# Patient Record
Sex: Female | Born: 1975
Health system: Southern US, Community
[De-identification: ages and names within clinical notes are randomized; demographics above are authoritative.]

## PROBLEM LIST (undated history)

## (undated) DIAGNOSIS — Z9889 Other specified postprocedural states: Secondary | ICD-10-CM

## (undated) DIAGNOSIS — T4145XA Adverse effect of unspecified anesthetic, initial encounter: Secondary | ICD-10-CM

## (undated) DIAGNOSIS — T8859XA Other complications of anesthesia, initial encounter: Secondary | ICD-10-CM

## (undated) DIAGNOSIS — J302 Other seasonal allergic rhinitis: Secondary | ICD-10-CM

## (undated) DIAGNOSIS — R112 Nausea with vomiting, unspecified: Secondary | ICD-10-CM

## (undated) DIAGNOSIS — F3281 Premenstrual dysphoric disorder: Secondary | ICD-10-CM

## (undated) DIAGNOSIS — J45909 Unspecified asthma, uncomplicated: Secondary | ICD-10-CM

## (undated) HISTORY — DX: Premenstrual dysphoric disorder: F32.81

## (undated) HISTORY — PX: ABLATION: SHX5711

---
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
## 2012-01-30 HISTORY — PX: TUBAL LIGATION: SHX77

## 2013-04-09 ENCOUNTER — Encounter: Payer: Self-pay | Admitting: Family Medicine

## 2013-04-09 ENCOUNTER — Ambulatory Visit (INDEPENDENT_AMBULATORY_CARE_PROVIDER_SITE_OTHER): Payer: 59 | Admitting: Family Medicine

## 2013-04-09 VITALS — BP 110/72 | HR 91 | Ht 64.5 in | Wt 151.0 lb

## 2013-04-09 DIAGNOSIS — N943 Premenstrual tension syndrome: Secondary | ICD-10-CM

## 2013-04-09 DIAGNOSIS — R41840 Attention and concentration deficit: Secondary | ICD-10-CM

## 2013-04-09 DIAGNOSIS — F3281 Premenstrual dysphoric disorder: Secondary | ICD-10-CM | POA: Insufficient documentation

## 2013-04-09 MED ORDER — CITALOPRAM HYDROBROMIDE 20 MG PO TABS: 20.0000 mg | ORAL_TABLET | Freq: Every day | ORAL | Status: DC

## 2013-04-09 NOTE — Progress Notes (Signed)
CC: Megan Roman is a 37 y.o. 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 Symptoms are predictable and moderate in severity. They improve after she is done with menses. She reports irritability and emotional swings during these periods. Nothing makes better or worse. She has been on BuSpar in the past without much benefit. She has been on birth control medicine in the past to help regulate cycles however worsened symptoms and usually have worse side effects than presenting problem. Symptoms can be on a daily basis during the above timeline.  Patient reports difficulty completing 1 past prior to starting another one. This has been present for years has not been getting better or worse. She does feel it gets in the way of her job and home responsibilities. Mild to moderate in severity. Symptoms are greatly improved when she was taking phentermine for diet. Has never been on medication for concentration difficulty. Symptoms are present on a daily basis.  Review of Systems - General ROS: negative for - chills, fever, night sweats, weight gain or weight loss Ophthalmic ROS: negative for - decreased vision Psychological ROS: negative for - anxiety or depression other than described above near menses ENT ROS: negative for - hearing change, nasal congestion, tinnitus or allergies Hematological and Lymphatic ROS: negative for - bleeding problems, bruising or swollen lymph nodes Breast ROS: negative Respiratory ROS: no cough, shortness of breath, or wheezing Cardiovascular ROS: no chest pain or dyspnea on exertion Gastrointestinal ROS: no abdominal pain, change in bowel habits, or black or bloody stools Genito-Urinary ROS: negative for - genital discharge, genital ulcers, incontinence or  abnormal bleeding from genitals Musculoskeletal ROS: negative for - joint pain or muscle pain Neurological ROS: negative for - headaches or memory loss Dermatological ROS: negative for lumps, mole changes, rash and skin lesion changes  History reviewed. No pertinent past medical history.   Family History  Problem Relation Age of Onset  . Alcohol abuse Paternal Grandfather   . Depression Mother   . Diabetes      paternal grandparents  . Hyperlipidemia Father   . Hypertension Father      History  Substance Use Topics  . Smoking status: Never Smoker   . Smokeless tobacco: Not on file  . Alcohol Use: Yes     Comment: less than 1 a week     Objective: Filed Vitals:   04/09/13 0905  BP: 110/72  Pulse: 91    General: Alert and Oriented, No Acute Distress HEENT: Pupils equal, round, reactive to light. Conjunctivae clear.  Moist mucous membranes pharynx unremarkable Lungs: Clear to auscultation bilaterally, no wheezing/ronchi/rales.  Comfortable work of breathing. Good air movement. Cardiac: Regular rate and rhythm. Normal S1/S2.  No murmurs, rubs, nor gallops.   Extremities: No peripheral edema.  Strong peripheral pulses.  Mental Status: No depression, anxiety, nor agitation. Skin: Warm and dry.  Assessment & Plan: [name] was seen today for establish care and discuss anxiety issues.  Diagnoses and associated orders for this visit:  PMDD (premenstrual dysphoric disorder) - citalopram (CELEXA) 20 MG tablet; Take 1 tablet (20 mg total) by mouth daily.  Attention and concentration deficit - Ambulatory referral to Psychiatry    PMDD: Start citalopram on a daily basis return in one month for reevaluation of symptoms Attention and concentration  difficulty:   referral for ADHD evaluation, will be interesting to see if anxiety and depression are improved by citalopram and if this carries over to help with attention and concentration Return in about 4 weeks (around 05/07/2013) for  PMDD FU.

## 2013-04-22 ENCOUNTER — Telehealth: Payer: Self-pay | Admitting: *Deleted

## 2013-04-22 DIAGNOSIS — Z20828 Contact with and (suspected) exposure to other viral communicable diseases: Secondary | ICD-10-CM

## 2013-04-22 MED ORDER — OSELTAMIVIR PHOSPHATE 75 MG PO CAPS: 75.0000 mg | ORAL_CAPSULE | Freq: Two times a day (BID) | ORAL | Status: DC

## 2013-04-22 MED ORDER — OSELTAMIVIR PHOSPHATE 75 MG PO CAPS: 75.0000 mg | ORAL_CAPSULE | Freq: Two times a day (BID) | ORAL | Status: AC

## 2013-04-22 NOTE — Telephone Encounter (Signed)
Rx sent to closer pharmacy

## 2013-04-22 NOTE — Telephone Encounter (Signed)
Rx sent to [facility] outpatient pharmacy

## 2013-04-22 NOTE — Telephone Encounter (Signed)
Pt states her co worker had the flu and she would like tamiflu since she was exposed.Please advise

## 2013-04-22 NOTE — Telephone Encounter (Signed)
Pt.notified

## 2013-04-22 NOTE — Telephone Encounter (Signed)
Mom calls and states that her daughter was positive for the flu at doctor this morning and wanted to know if you would send in Tamiflu prophylactic for her. Barry Dienes, LPN

## 2013-05-13 ENCOUNTER — Ambulatory Visit: Payer: 59 | Admitting: Family Medicine

## 2013-07-23 ENCOUNTER — Emergency Department
Admission: EM | Admit: 2013-07-23 | Discharge: 2013-07-23 | Disposition: A | Discharge status: Home / Self Care | Payer: 59 | Source: Home / Self Care | Attending: Emergency Medicine | Admitting: Emergency Medicine

## 2013-07-23 ENCOUNTER — Encounter: Payer: Self-pay | Admitting: Emergency Medicine

## 2013-07-23 DIAGNOSIS — R05 Cough: Secondary | ICD-10-CM

## 2013-07-23 DIAGNOSIS — R058 Other specified cough: Secondary | ICD-10-CM

## 2013-07-23 DIAGNOSIS — R059 Cough, unspecified: Secondary | ICD-10-CM

## 2013-07-23 HISTORY — DX: Other seasonal allergic rhinitis: J30.2

## 2013-07-23 HISTORY — DX: Unspecified asthma, uncomplicated: J45.909

## 2013-07-23 MED ORDER — BUDESONIDE-FORMOTEROL FUMARATE 80-4.5 MCG/ACT IN AERO: 2.0000 | INHALATION_SPRAY | Freq: Two times a day (BID) | RESPIRATORY_TRACT | Status: DC

## 2013-07-23 MED ORDER — PREDNISONE (PAK) 10 MG PO TABS: ORAL_TABLET | ORAL | Status: DC

## 2013-07-23 NOTE — ED Notes (Signed)
Pt c/o productive cough and post nasal drip x 1 wk. Denies fever.

## 2013-07-23 NOTE — ED Provider Notes (Signed)
CSN: [id number]     Arrival date & time 07/23/13  1817 History   First MD Initiated Contact with Patient 07/23/13 1840     Chief Complaint  Patient presents with  . Cough    Patient is a [age] y.o. female presenting with cough. The history is provided by the patient.  Cough Cough characteristics:  Non-productive Severity:  Moderate Onset quality:  Gradual Duration:  1 week Timing:  Intermittent Progression:  Worsening Chronicity: History of seasonal asthma, diagnosed by pulmonologist at [facility]. Smoker: no   Context: animal exposure (Cat at home, which she feels triggered this) and weather changes   Context: not fumes, not occupational exposure, not sick contacts and not with activity   Relieved by:  Nothing Exacerbated by: Change in the weather. Associated symptoms: rhinorrhea (Serous), sinus congestion (Mild) and wheezing (rarely, not currently)   Associated symptoms: no chest pain, no chills, no diaphoresis, no ear fullness, no ear pain, no eye discharge, no fever, no headaches, no myalgias, no rash, no shortness of breath, no sore throat and no weight loss   Risk factors: no recent infection and no recent travel    No fever or chills or discolored rhinorrhea .  Past Medical History  Diagnosis Date  . Asthma   . Seasonal allergies    Past Surgical History  Procedure Laterality Date  . Cesarean section  10/2007  . Cesarean section  01/2012  . Tubal ligation  01/2012  . Cesarean section     Family History  Problem Relation Age of Onset  . Alcohol abuse Paternal Grandfather   . Depression Mother   . Hypertension Mother   . Hyperlipidemia Mother   . Asthma Mother   . Diabetes      paternal grandparents  . Hyperlipidemia Father   . Hypertension Father    History  Substance Use Topics  . Smoking status: Never Smoker   . Smokeless tobacco: Not on file  . Alcohol Use: Yes     Comment: less than 1 a week   OB History   Grav Para Term Preterm Abortions TAB SAB  Ect Mult Living                 Review of Systems  Constitutional: Negative for fever, chills, weight loss and diaphoresis.  HENT: Positive for rhinorrhea (Serous). Negative for ear pain and sore throat.   Eyes: Negative for discharge.  Respiratory: Positive for cough and wheezing (rarely, not currently). Negative for shortness of breath.   Cardiovascular: Negative for chest pain.  Musculoskeletal: Negative for myalgias.  Skin: Negative for rash.  Neurological: Negative for headaches.  All other systems reviewed and are negative.    Allergies  Review of patient's allergies indicates no known allergies.  Home Medications   Current Outpatient Rx  Name  Route  Sig  Dispense  Refill  . fexofenadine (ALLEGRA) 180 MG tablet   Oral   Take 180 mg by mouth daily.         . fluticasone (FLONASE) 50 MCG/ACT nasal spray   Each Nare   Place into both nostrils daily.         . B Complex Vitamins (VITAMIN B COMPLEX IJ)   Injection   Inject as directed.         . budesonide-formoterol (SYMBICORT) 80-4.5 MCG/ACT inhaler   Inhalation   Inhale 2 puffs into the lungs 2 (two) times daily.   1 Inhaler   1   . citalopram (CELEXA)  20 MG tablet   Oral   Take 1 tablet (20 mg total) by mouth daily.   30 tablet   2   . DiphenhydrAMINE HCl (ALLERGY MEDICATION PO)   Oral   Take by mouth.         . phentermine 37.5 MG capsule   Oral   Take 37.5 mg by mouth every morning.         . predniSONE (STERAPRED UNI-PAK) 10 MG tablet      Take as directed for 6 days.--Take 6 on day 1, 5 on day 2, 4 on day 3, then 3 tablets on day 4, then 2 tablets on day 5, then 1 on day 6.   21 tablet   0    BP 111/80  Pulse 89  Temp(Src) 97.6 F (36.4 C) (Oral)  Resp 18  Ht 5' 4.5" (1.638 m)  Wt 146 lb (66.225 kg)  BMI 24.68 kg/m2  SpO2 99%  LMP 07/19/2013 Physical Exam  Nursing note and vitals reviewed. Constitutional: She is oriented to person, place, and time. She appears  well-developed and well-nourished. No distress.  HENT:  Head: Normocephalic and atraumatic.  Right Ear: Tympanic membrane normal.  Left Ear: Tympanic membrane normal.  Nose: Nose normal.  Mouth/Throat: Oropharynx is clear and moist and mucous membranes are normal. No oropharyngeal exudate.  Minimally boggy nasal turbinates, mild serous nasal drainage  Pharynx: Negative except minimal lymphoid hyperplasia posterior pharynx  Eyes: Right eye exhibits no discharge. Left eye exhibits no discharge. No scleral icterus.  Neck: Neck supple.  No adenopathy  Cardiovascular: Normal rate, regular rhythm and normal heart sounds.   Pulmonary/Chest: Effort normal. No respiratory distress. She has no decreased breath sounds. She has no wheezes. She has no rhonchi. She has no rales.  Lungs are clear. Except, rare late expiratory wheeze heard anteriorly on forced expiration only.  Occasional cough noted  Lymphadenopathy:    She has no cervical adenopathy.  Neurological: She is alert and oriented to person, place, and time.  Skin: Skin is warm and dry. No rash noted.  Psychiatric: She has a normal mood and affect.    ED Course  Procedures (including critical care time) Labs Review Labs Reviewed - No data to display Imaging Review No results found.   MDM   1. Allergic cough    Treatment options discussed, as well as risks, benefits, alternatives. Patient voiced understanding and agreement with the following plans: Prednisone 10 mg-6 day Dosepak, as this helped about a year ago when she had similar symptoms. Continue Flonase and Allegra As she's run out of the Symbicort that was prescribed by her pulmonologist in the past, I sent refill of Symbicort 80/4.5 to the [facility] pharmacy at her request. In my opinion, no sign of bacterial infection, so I'm not prescribing an antibiotic. If she develops bacterial URI symptoms, followup with PCP. Also advised to followup with her  pulmonologist.. Follow-up with your primary care doctor in 5-7 days if not improving, or sooner if symptoms become worse. Precautions discussed. Red flags discussed. Questions invited and answered. Patient voiced understanding and agreement.    Lajean Manes, MD 07/23/13 1910

## 2014-05-21 ENCOUNTER — Encounter: Payer: Self-pay | Admitting: Family Medicine

## 2014-05-21 ENCOUNTER — Ambulatory Visit (INDEPENDENT_AMBULATORY_CARE_PROVIDER_SITE_OTHER): Payer: 59 | Admitting: Family Medicine

## 2014-05-21 ENCOUNTER — Other Ambulatory Visit: Payer: Self-pay | Admitting: *Deleted

## 2014-05-21 VITALS — BP 101/68 | HR 83 | Ht 64.5 in | Wt 150.0 lb

## 2014-05-21 DIAGNOSIS — Z0000 Encounter for general adult medical examination without abnormal findings: Secondary | ICD-10-CM

## 2014-05-21 DIAGNOSIS — N943 Premenstrual tension syndrome: Secondary | ICD-10-CM

## 2014-05-21 DIAGNOSIS — F3281 Premenstrual dysphoric disorder: Secondary | ICD-10-CM

## 2014-05-21 DIAGNOSIS — R41840 Attention and concentration deficit: Secondary | ICD-10-CM

## 2014-05-21 LAB — COMPLETE METABOLIC PANEL WITH GFR
ALK PHOS: 47 U/L (ref 39–117)
ALT: 18 U/L (ref 0–35)
AST: 14 U/L (ref 0–37)
Albumin: 4.3 g/dL (ref 3.5–5.2)
BILIRUBIN TOTAL: 0.7 mg/dL (ref 0.2–1.2)
BUN: 14 mg/dL (ref 6–23)
CHLORIDE: 103 meq/L (ref 96–112)
CO2: 29 meq/L (ref 19–32)
Calcium: 9.3 mg/dL (ref 8.4–10.5)
Creat: 0.73 mg/dL (ref 0.50–1.10)
GFR, Est African American: >89 mL/min
GLUCOSE: 88 mg/dL (ref 70–99)
POTASSIUM: 4.3 meq/L (ref 3.5–5.3)
Sodium: 139 mEq/L (ref 135–145)
Total Protein: 6.6 g/dL (ref 6.0–8.3)

## 2014-05-21 LAB — CBC
HEMATOCRIT: 42.3 % (ref 36.0–46.0)
Hemoglobin: 14.4 g/dL (ref 12.0–15.0)
MCH: 32.9 pg (ref 26.0–34.0)
MCHC: 34 g/dL (ref 30.0–36.0)
MCV: 96.6 fL (ref 78.0–100.0)
MPV: 10.3 fL (ref 8.6–12.4)
PLATELETS: 240 10*3/uL (ref 150–400)
RBC: 4.38 MIL/uL (ref 3.87–5.11)
RDW: 13.6 % (ref 11.5–15.5)
WBC: 7.2 10*3/uL (ref 4.0–10.5)

## 2014-05-21 LAB — LIPID PANEL
Cholesterol: 173 mg/dL (ref 0–200)
HDL: 57 mg/dL (ref >39)
LDL Cholesterol: 102 mg/dL — ABNORMAL HIGH (ref 0–99)
Total CHOL/HDL Ratio: 3 Ratio
Triglycerides: 70 mg/dL (ref <150)
VLDL: 14 mg/dL (ref 0–40)

## 2014-05-21 LAB — TSH: TSH: 0.882 u[IU]/mL (ref 0.350–4.500)

## 2014-05-21 MED ORDER — VENLAFAXINE HCL ER 75 MG PO CP24: 75.0000 mg | ORAL_CAPSULE | Freq: Every day | ORAL | Status: DC

## 2014-05-21 NOTE — Progress Notes (Signed)
CC: Megan Roman is a 39 y.o. female is here for Annual Exam   Subjective: HPI:  Colonoscopy: No current indication,  No family history of colon cancer Papsmear: Pap in August  Was normal Mammogram: Mammo in 36 only saw fibrous tissue, repeat age 39  Influenza Vaccine:  Up-to-date this season Pneumovax:  No current indication Td/Tdap:  Up-to-date as of 2014 Zoster: (Start 39 yo)   presents for complete physical exam   She tells me that  Citalopram caused intolerable nausea and GI upset. Her OB/GYN provided her with Wellbutrin 150 mg in August and she's been taking it on a daily basis however she still has extreme irritability the 2 weeks saddling  Her menstrual cycle. Symptoms are bad enough to where they  Cause stress on everybody in her family. She wants no further something else that can help with his irritability.  Review of Systems - General ROS: negative for - chills, fever, night sweats, weight gain or weight loss Ophthalmic ROS: negative for - decreased vision Psychological ROS: negative for - anxiety or depression ENT ROS: negative for - hearing change, nasal congestion, tinnitus or allergies Hematological and Lymphatic ROS: negative for - bleeding problems, bruising or swollen lymph nodes Breast ROS: negative Respiratory ROS: no cough, shortness of breath, or wheezing Cardiovascular ROS: no chest pain or dyspnea on exertion Gastrointestinal ROS: no abdominal pain, change in bowel habits, or black or bloody stools Genito-Urinary ROS: negative for - genital discharge, genital ulcers, incontinence or abnormal bleeding from genitals Musculoskeletal ROS: negative for - joint pain or muscle pain Neurological ROS: negative for - headaches or memory loss Dermatological ROS: negative for lumps, mole changes, rash and skin lesion changes  Past Medical History  Diagnosis Date  . Asthma   . Seasonal allergies     Past Surgical History  Procedure Laterality Date  . Cesarean  section  10/2007  . Cesarean section  01/2012  . Tubal ligation  01/2012  . Cesarean section     Family History  Problem Relation Age of Onset  . Alcohol abuse Paternal Grandfather   . Depression Mother   . Hypertension Mother   . Hyperlipidemia Mother   . Asthma Mother   . Diabetes      paternal grandparents  . Hyperlipidemia Father   . Hypertension Father     History   Social History  . Marital Status: Married    Spouse Name: N/A    Number of Children: N/A  . Years of Education: N/A   Occupational History  . Not on file.   Social History Main Topics  . Smoking status: Never Smoker   . Smokeless tobacco: Not on file  . Alcohol Use: Yes     Comment: less than 1 a week  . Drug Use: No  . Sexual Activity:    Partners: Male   Other Topics Concern  . Not on file   Social History Narrative     Objective: BP 101/68 mmHg  Pulse 83  Ht 5' 4.5" (1.638 m)  Wt 150 lb (68.04 kg)  BMI 25.36 kg/m2  General: No Acute Distress HEENT: Atraumatic, normocephalic, conjunctivae normal without scleral icterus.  No nasal discharge, hearing grossly intact, TMs with good landmarks bilaterally with no middle ear abnormalities, posterior pharynx clear without oral lesions. Neck: Supple, trachea midline, no cervical nor supraclavicular adenopathy. Pulmonary: Clear to auscultation bilaterally without wheezing, rhonchi, nor rales. Cardiac: Regular rate and rhythm.  No murmurs, rubs, nor gallops. No peripheral edema.  2+ peripheral pulses bilaterally. Abdomen: Bowel sounds normal.  No masses.  Non-tender without rebound.  Negative Murphy's sign. ZO:XWRUEAVW to GYN MSK: Grossly intact, no signs of weakness.  Full strength throughout upper and lower extremities.  Full ROM in upper and lower extremities.  No midline spinal tenderness. Neuro: Gait unremarkable, CN II-XII grossly intact.  C5-C6 Reflex 2/4 Bilaterally, L4 Reflex 2/4 Bilaterally.  Cerebellar function intact. Skin: No  rashes. Psych: Alert and oriented to person/place/time.  Thought process normal. No anxiety/depression.   Assessment & Plan: Megan Roman was seen today for annual exam.  Diagnoses and associated orders for this visit:  Annual physical exam - Lipid panel - COMPLETE METABOLIC PANEL WITH GFR - CBC - TSH  PMDD (premenstrual dysphoric disorder) - venlafaxine XR (EFFEXOR XR) 75 MG 24 hr capsule; Take 1 capsule (75 mg total) by mouth daily with breakfast.    Healthy lifestyle interventions including but not limited to regular exercise, a healthy low fat diet, moderation of salt intake, the dangers of tobacco/alcohol/recreational drug use, nutrition supplementation, and accident avoidance were discussed with the patient and a handout was provided for future reference.  PMDD appears to be uncontrolled therefore stop Wellbutrin and switch to Effexor.  Return in about 3 months (around 08/20/2014) for Mood follow-up.

## 2014-05-21 NOTE — Patient Instructions (Signed)
Dr. Paulyne Mooty's General Advice Following Your Complete Physical Exam  The Benefits of Regular Exercise: Unless you suffer from an uncontrolled cardiovascular condition, studies strongly suggest that regular exercise and physical activity will add to both the quality and length of your life.  The World Health Organization recommends 150 minutes of moderate intensity aerobic activity every week.  This is best split over 3-4 days a week, and can be as simple as a brisk walk for just over 35 minutes "most days of the week".  This type of exercise has been shown to lower LDL-Cholesterol, lower average blood sugars, lower blood pressure, lower cardiovascular disease risk, improve memory, and increase one's overall sense of wellbeing.  The addition of anaerobic (or "strength training") exercises offers additional benefits including but not limited to increased metabolism, prevention of osteoporosis, and improved overall cholesterol levels.  How Can I Strive For A Low-Fat Diet?: Current guidelines recommend that 25-35 percent of your daily energy (food) intake should come from fats.  One might ask how can this be achieved without having to dissect each meal on a daily basis?  Switch to skim or 1% milk instead of whole milk.  Focus on lean meats such as ground turkey, fresh fish, baked chicken, and lean cuts of beef as your source of dietary protein.  Limit saturated fat consumption to less than 10% of your daily caloric intake.  Limit trans fatty acid consumption primarily by limiting synthetic trans fats such as partially hydrogenated oils (Ex: fried fast foods).  Substitute olive or vegetable oil for solid fats where possible.  Moderation of Salt Intake: Provided you don't carry a diagnosis of congestive heart failure nor renal failure, I recommend a daily allowance of no more than 2300 mg of salt (sodium).  Keeping under this daily goal is associated with a decreased risk of cardiovascular events, creeping  above it can lead to elevated blood pressures and increases your risk of cardiovascular events.  Milligrams (mg) of salt is listed on all nutrition labels, and your daily intake can add up faster than you think.  Most canned and frozen dinners can pack in over half your daily salt allowance in one meal.    Lifestyle Health Risks: Certain lifestyle choices carry specific health risks.  As you may already know, tobacco use has been associated with increasing one's risk of cardiovascular disease, pulmonary disease, numerous cancers, among many other issues.  What you may not know is that there are medications and nicotine replacement strategies that can more than double your chances of successfully quitting.  I would be thrilled to help manage your quitting strategy if you currently use tobacco products.  When it comes to alcohol use, I've yet to find an "ideal" daily allowance.  Provided an individual does not have a medical condition that is exacerbated by alcohol consumption, general guidelines determine "safe drinking" as no more than two standard drinks for a man or no more than one standard drink for a female per day.  However, much debate still exists on whether any amount of alcohol consumption is technically "safe".  My general advice, keep alcohol consumption to a minimum for general health promotion.  If you or others believe that alcohol, tobacco, or recreational drug use is interfering with your life, I would be happy to provide confidential counseling regarding treatment options.  General "Over The Counter" Nutrition Advice: Postmenopausal women should aim for a daily calcium intake of 1200 mg, however a significant portion of this might already be   provided by diets including milk, yogurt, cheese, and other dairy products.  Vitamin D has been shown to help preserve bone density, prevent fatigue, and has even been shown to help reduce falls in the elderly.  Ensuring a daily intake of 800 Units of  Vitamin D is a good place to start to enjoy the above benefits, we can easily check your Vitamin D level to see if you'd potentially benefit from supplementation beyond 800 Units a day.  Folic Acid intake should be of particular concern to women of childbearing age.  Daily consumption of 400-800 mcg of Folic Acid is recommended to minimize the chance of spinal cord defects in a fetus should pregnancy occur.    For many adults, accidents still remain one of the most common culprits when it comes to cause of death.  Some of the simplest but most effective preventitive habits you can adopt include regular seatbelt use, proper helmet use, securing firearms, and regularly testing your smoke and carbon monoxide detectors.  Satia Winger B. Elzia Hott DO [facility] [address], [address] [place], [place] [address] Phone: [phone]  

## 2014-06-05 ENCOUNTER — Encounter: Payer: Self-pay | Admitting: Family Medicine

## 2014-06-08 MED ORDER — SERTRALINE HCL 25 MG PO TABS: 25.0000 mg | ORAL_TABLET | Freq: Every day | ORAL | Status: DC

## 2014-10-10 ENCOUNTER — Emergency Department
Admission: EM | Admit: 2014-10-10 | Discharge: 2014-10-10 | Disposition: A | Discharge status: Home / Self Care | Payer: 59 | Source: Home / Self Care | Attending: Family Medicine | Admitting: Family Medicine

## 2014-10-10 ENCOUNTER — Encounter: Payer: Self-pay | Admitting: Emergency Medicine

## 2014-10-10 DIAGNOSIS — J209 Acute bronchitis, unspecified: Secondary | ICD-10-CM | POA: Diagnosis not present

## 2014-10-10 DIAGNOSIS — M940 Chondrocostal junction syndrome [Tietze]: Secondary | ICD-10-CM

## 2014-10-10 MED ORDER — GUAIFENESIN-CODEINE 100-10 MG/5ML PO SOLN: ORAL | Status: DC

## 2014-10-10 MED ORDER — PREDNISONE 20 MG PO TABS: 20.0000 mg | ORAL_TABLET | Freq: Two times a day (BID) | ORAL | Status: DC

## 2014-10-10 MED ORDER — LIDOCAINE 5 % EX PTCH: 1.0000 | MEDICATED_PATCH | CUTANEOUS | Status: DC

## 2014-10-10 MED ORDER — DOXYCYCLINE HYCLATE 100 MG PO CAPS: 100.0000 mg | ORAL_CAPSULE | Freq: Two times a day (BID) | ORAL | Status: DC

## 2014-10-10 NOTE — ED Provider Notes (Signed)
CSN: [id number]     Arrival date & time 10/10/14  1019 History   First MD Initiated Contact with Patient 10/10/14 1121     Chief Complaint  Patient presents with  . Bronchitis     HPI Comments: Patient complains of onset of increased sinus congestion and itchy eyes about two weeks ago.  She began using Flonase and her symptoms improved.  One week ago she developed an increasing non-productive cough and mild shortness of breath with activity.  Her cough is worse at night.  She has now developed chills/sweats.  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  She notes that Mobic  decreases her pain. She states that she has a history of asthma and exercise induced asthma.  Four days ago she resumed using Symbicort 2 puffs BID.  The history is provided by the patient.    Past Medical History  Diagnosis Date  . Asthma   . Seasonal allergies    Past Surgical History  Procedure Laterality Date  . Cesarean section  10/2007  . Cesarean section  01/2012  . Tubal ligation  01/2012  . Cesarean section     Family History  Problem Relation Age of Onset  . Alcohol abuse Paternal Grandfather   . Depression Mother   . Hypertension Mother   . Hyperlipidemia Mother   . Asthma Mother   . Diabetes      paternal grandparents  . Hyperlipidemia Father   . Hypertension Father    History  Substance Use Topics  . Smoking status: Never Smoker   . Smokeless tobacco: Not on file  . Alcohol Use: Yes     Comment: less than 1 a week   OB History    No data available     Review of Systems No sore throat + hoarseness + cough + right pleuritic pain No wheezing + nasal congestion + post-nasal drainage No sinus pain/pressure No itchy/red eyes No earache No hemoptysis + SOB with activity No fever, + chills No nausea No vomiting No abdominal pain No diarrhea No urinary symptoms No  skin rash + fatigue No myalgias No headache Used OTC meds without relief  Allergies  Celexa  Home Medications   Prior to Admission medications   Medication Sig Start Date End Date Taking? Authorizing Provider  B Complex Vitamins (VITAMIN B COMPLEX IJ) Inject as directed.    Historical Provider, MD  budesonide-formoterol (SYMBICORT) 80-4.5 MCG/ACT inhaler Inhale 2 puffs into the lungs 2 (two) times daily. 07/23/13   Lajean Manes, MD  DiphenhydrAMINE HCl (ALLERGY MEDICATION PO) Take by mouth.    Historical Provider, MD  doxycycline (VIBRAMYCIN) 100 MG capsule Take 1 capsule (100 mg total) by mouth 2 (two) times daily. Take with food (Rx void after 10/18/14) 10/10/14   Lattie Haw, MD  fexofenadine (ALLEGRA) 180 MG tablet Take 180 mg by mouth daily.    Historical Provider, MD  fluticasone (FLONASE) 50 MCG/ACT nasal spray Place into both nostrils daily.    Historical Provider, MD  guaiFENesin-codeine 100-10 MG/5ML syrup Take 10mL by mouth at bedtime as needed for cough 10/10/14   Lattie Haw, MD  lidocaine (LIDODERM) 5 % Place 1 patch onto the skin daily. Remove & Discard patch within 12 hours.  Use at night. 10/10/14   Lattie Haw, MD  predniSONE (DELTASONE) 20 MG tablet Take 1 tablet (20 mg total) by mouth  2 (two) times daily. Take with food. 10/10/14   Lattie Haw, MD  sertraline (ZOLOFT) 25 MG tablet Take 1 tablet (25 mg total) by mouth daily. 06/08/14   Sean Hommel, DO   BP 113/77 mmHg  Pulse 79  Temp(Src) 98.3 F (36.8 C) (Oral)  Resp 16  Ht 5' 4.5" (1.638 m)  Wt 145 lb (65.772 kg)  BMI 24.51 kg/m2  SpO2 99% Physical Exam  Constitutional: She is oriented to person, place, and time. She appears well-developed and well-nourished. No distress.  HENT:  Head: Normocephalic.  Right Ear: Tympanic membrane, external ear and ear canal normal.  Left Ear: Tympanic membrane, external ear and ear canal normal.  Nose: Nose normal.  Mouth/Throat: Oropharynx is clear and moist. No  oropharyngeal exudate.  Neck: Neck supple.  Cardiovascular: Normal heart sounds.   Pulmonary/Chest: Breath sounds normal. No respiratory distress. She has no wheezes. She has no rales. She exhibits tenderness.    Patient has distinct tenderness to palpation over right anterior/inferior ribs as noted on diagram.    Abdominal: There is no tenderness.  Musculoskeletal: She exhibits no edema.  Lymphadenopathy:    She has no cervical adenopathy.  Neurological: She is alert and oriented to person, place, and time.  Skin: Skin is warm and dry. No rash noted.  Nursing note and vitals reviewed.   ED Course  Procedures  none  MDM   1. Acute bronchitis, unspecified organism; probably viral etiology   2. Costochondritis    Begin prednisone burst.  Rx for Robitussin AC for night time cough.  Trial of lidocaine patch at bedtime for recurrent costochondritis. Take plain guaifenesin (1200mg  extended release tabs such as Mucinex) twice daily, with plenty of water, for cough and congestion.  May add Pseudoephedrine (30mg , one or two every 4 to 6 hours) for sinus congestion.  Get adequate rest.   May use Afrin nasal spray (or generic oxymetazoline) twice daily for about 5 days.  Also recommend using saline nasal spray several times daily and saline nasal irrigation (AYR is a common brand).  Use Flonase nasal spray each morning after using Afrin nasal spray and saline nasal irrigation. Stop all antihistamines for now, and other non-prescription cough/cold preparations. Continue Symbicort, and albuterol inhaler as needed. Begin Doxycycline if not improving about one week or if persistent fever develops (Given a prescription to hold, with an expiration date)  Follow-up with family doctor if not improving about10 days.     Lattie Haw, MD 10/17/14 1050

## 2014-10-10 NOTE — Discharge Instructions (Signed)
Take plain guaifenesin (1200mg  extended release tabs such as Mucinex) twice daily, with plenty of water, for cough and congestion.  May add Pseudoephedrine (30mg , one or two every 4 to 6 hours) for sinus congestion.  Get adequate rest.   May use Afrin nasal spray (or generic oxymetazoline) twice daily for about 5 days.  Also recommend using saline nasal spray several times daily and saline nasal irrigation (AYR is a common brand).  Use Flonase nasal spray each morning after using Afrin nasal spray and saline nasal irrigation. Stop all antihistamines for now, and other non-prescription cough/cold preparations. Continue Symbicort, and albuterol inhaler as needed. Begin Doxycycline if not improving about one week or if persistent fever develops   Follow-up with family doctor if not improving about10 days.

## 2014-10-10 NOTE — ED Notes (Signed)
Patient C/O a bronchitis type cough times one and a half weeks. Fever unknown feels that she has had fever due to intermittent chills and sweats

## 2014-10-14 ENCOUNTER — Telehealth: Payer: Self-pay | Admitting: *Deleted

## 2014-10-14 MED ORDER — ALBUTEROL SULFATE HFA 108 (90 BASE) MCG/ACT IN AERS: INHALATION_SPRAY | RESPIRATORY_TRACT | Status: DC

## 2014-10-14 NOTE — Telephone Encounter (Signed)
LMOM notifying pt of rx for albuterol.

## 2014-10-14 NOTE — Telephone Encounter (Signed)
Amber,  Rx just now sent for albuterol.

## 2014-10-14 NOTE — Telephone Encounter (Signed)
Pt calls today stating that when she was seen in UC sat & was advised an her discharge summary to use a rescue inhaler but she doesn't have one. She is using her symbicort & is taking the medications rx this weekend but had a terrrible coughing fit last night & had a hard time catching her breath.  Any way you could send one in for her? She prefers [facility].  Please advise.

## 2015-02-19 ENCOUNTER — Encounter: Payer: Self-pay | Admitting: Family Medicine

## 2015-03-19 ENCOUNTER — Telehealth: Payer: 59 | Admitting: Nurse Practitioner

## 2015-03-19 DIAGNOSIS — J0100 Acute maxillary sinusitis, unspecified: Secondary | ICD-10-CM

## 2015-03-19 MED ORDER — AZITHROMYCIN 250 MG PO TABS: ORAL_TABLET | ORAL | Status: DC

## 2015-03-19 NOTE — Progress Notes (Signed)

## 2015-06-28 MED FILL — PHENTERMINE 37.5 MG TABLET: 37.5 | 30 days supply | Qty: 30 | Fill #0

## 2015-07-07 ENCOUNTER — Encounter: Payer: Self-pay | Admitting: Family Medicine

## 2015-07-07 ENCOUNTER — Ambulatory Visit (INDEPENDENT_AMBULATORY_CARE_PROVIDER_SITE_OTHER): Payer: 59 | Admitting: Family Medicine

## 2015-07-07 VITALS — BP 99/66 | HR 70 | Wt 155.0 lb

## 2015-07-07 DIAGNOSIS — E162 Hypoglycemia, unspecified: Secondary | ICD-10-CM | POA: Diagnosis not present

## 2015-07-07 DIAGNOSIS — F411 Generalized anxiety disorder: Secondary | ICD-10-CM

## 2015-07-07 DIAGNOSIS — R002 Palpitations: Secondary | ICD-10-CM | POA: Diagnosis not present

## 2015-07-07 DIAGNOSIS — R5382 Chronic fatigue, unspecified: Secondary | ICD-10-CM | POA: Diagnosis not present

## 2015-07-07 LAB — COMPLETE METABOLIC PANEL WITH GFR
ALBUMIN: 4.2 g/dL (ref 3.6–5.1)
ALK PHOS: 38 U/L (ref 33–115)
ALT: 17 U/L (ref 6–29)
AST: 15 U/L (ref 10–30)
BUN: 16 mg/dL (ref 7–25)
CO2: 26 mmol/L (ref 20–31)
Calcium: 9.3 mg/dL (ref 8.6–10.2)
Chloride: 106 mmol/L (ref 98–110)
Creat: 0.75 mg/dL (ref 0.50–1.10)
GLUCOSE: 95 mg/dL (ref 65–99)
Potassium: 4.6 mmol/L (ref 3.5–5.3)
Sodium: 139 mmol/L (ref 135–146)
TOTAL PROTEIN: 6.4 g/dL (ref 6.1–8.1)
Total Bilirubin: 0.6 mg/dL (ref 0.2–1.2)

## 2015-07-07 LAB — CBC
HCT: 42 % (ref 36.0–46.0)
Hemoglobin: 14.4 g/dL (ref 12.0–15.0)
MCH: 33.3 pg (ref 26.0–34.0)
MCHC: 34.3 g/dL (ref 30.0–36.0)
MCV: 97.2 fL (ref 78.0–100.0)
MPV: 12.8 fL — AB (ref 8.6–12.4)
PLATELETS: 232 10*3/uL (ref 150–400)
RBC: 4.32 MIL/uL (ref 3.87–5.11)
RDW: 13.3 % (ref 11.5–15.5)
WBC: 5.7 10*3/uL (ref 4.0–10.5)

## 2015-07-07 LAB — TSH: TSH: 1.17 m[IU]/L

## 2015-07-07 NOTE — Progress Notes (Signed)
CC: Megan Roman is a 40 y.o. female is here for No chief complaint on file.   Subjective: HPI:  Recently had a nonfasting blood sugar checked and it was mildly in the hypoglycemic range. She was feeling fatigued at that time. She says that she's been feeling more fatigued than she is used to. Symptoms are worsened by frequent interruptions from her child in the middle the night. She denies any history of hyper or hypoglycemia other than that described above. She's tried cutting out carbohydrates in her diet but that also makes her feel more fatigued. She was recently prescribed phentermine to help with weight loss however this caused palpitations. She stopped this medication last week and had another episode of palpitations earlier this week while eating lunch. She denies any chest pain or shortness of breath.  Since the fall she's noticed that her anxiety is getting much worse. She's been off of Wellbutrin for over a year now and felt much better from an anxiety standpoint while taking this medication. She denies any depression or any other mental disturbance. She denies any focal weakness but still feels relatively fatigued on a daily basis.   Review Of Systems Outlined In HPI  Past Medical History  Diagnosis Date  . Asthma   . Seasonal allergies     Past Surgical History  Procedure Laterality Date  . Cesarean section  10/2007  . Cesarean section  01/2012  . Tubal ligation  01/2012  . Cesarean section     Family History  Problem Relation Age of Onset  . Alcohol abuse Paternal Grandfather   . Depression Mother   . Hypertension Mother   . Hyperlipidemia Mother   . Asthma Mother   . Diabetes      paternal grandparents  . Hyperlipidemia Father   . Hypertension Father     Social History   Social History  . Marital Status: Married    Spouse Name: N/A  . Number of Children: N/A  . Years of Education: N/A   Occupational History  . Not on file.   Social History Main  Topics  . Smoking status: Never Smoker   . Smokeless tobacco: Not on file  . Alcohol Use: Yes     Comment: less than 1 a week  . Drug Use: No  . Sexual Activity:    Partners: Male   Other Topics Concern  . Not on file   Social History Narrative     Objective: BP 99/66 mmHg  Pulse 70  Wt 155 lb (70.308 kg)  Vital signs reviewed. General: Alert and Oriented, No Acute Distress HEENT: Pupils equal, round, reactive to light. Conjunctivae clear.  External ears unremarkable.  Moist mucous membranes. Lungs: Clear and comfortable work of breathing, speaking in full sentences without accessory muscle use. Cardiac: Regular rate and rhythm.  Neuro: CN II-XII grossly intact, gait normal. Extremities: No peripheral edema.  Strong peripheral pulses.  Mental Status: No depression, anxiety, nor agitation. Logical though process. Skin: Warm and dry.  Assessment & Plan: Diagnoses and all orders for this visit:  Hypoglycemia -     Hemoglobin A1c -     COMPLETE METABOLIC PANEL WITH GFR -     CBC -     TSH  Palpitations -     Hemoglobin A1c -     COMPLETE METABOLIC PANEL WITH GFR -     CBC -     TSH  Chronic fatigue -     Hemoglobin A1c -  COMPLETE METABOLIC PANEL WITH GFR -     CBC -     TSH  Generalized anxiety disorder -     Hemoglobin A1c -     COMPLETE METABOLIC PANEL WITH GFR -     CBC -     TSH   Joint decision that if all labs are normal the plan will be to go back on Wellbutrin. She has requested that if we go this route she would prefer to have brand name Wellbutrin since it seemed to be most beneficial compared to the generic version.  Return if symptoms worsen or fail to improve.

## 2015-07-08 ENCOUNTER — Encounter: Payer: Self-pay | Admitting: Family Medicine

## 2015-07-08 ENCOUNTER — Telehealth: Payer: Self-pay | Admitting: Family Medicine

## 2015-07-08 DIAGNOSIS — E538 Deficiency of other specified B group vitamins: Secondary | ICD-10-CM

## 2015-07-08 DIAGNOSIS — F411 Generalized anxiety disorder: Secondary | ICD-10-CM

## 2015-07-08 LAB — HEMOGLOBIN A1C
Hgb A1c MFr Bld: 5.4 % (ref <5.7)
Mean Plasma Glucose: 108 mg/dL (ref <117)

## 2015-07-08 MED ORDER — WELLBUTRIN XL 150 MG PO TB24: 150.0000 mg | ORAL_TABLET | Freq: Every day | ORAL | Status: DC

## 2015-07-08 MED FILL — WELLBUTRIN XL 150 MG TABLET: 150 | 30 days supply | Qty: 30 | Fill #0

## 2015-07-08 NOTE — Telephone Encounter (Signed)
Will you please let patient know that her labs were normal and as we discussed I'll send in a Rx for wellbutrin to the [facility].

## 2015-07-08 NOTE — Telephone Encounter (Signed)
Pt.notified

## 2015-07-16 ENCOUNTER — Telehealth: Payer: 59 | Admitting: Family

## 2015-07-16 DIAGNOSIS — R059 Cough, unspecified: Secondary | ICD-10-CM

## 2015-07-16 DIAGNOSIS — R05 Cough: Secondary | ICD-10-CM | POA: Diagnosis not present

## 2015-07-16 MED ORDER — BENZONATATE 100 MG PO CAPS: 100.0000 mg | ORAL_CAPSULE | Freq: Three times a day (TID) | ORAL | Status: DC | PRN

## 2015-07-16 NOTE — Progress Notes (Signed)
We are sorry that you are not feeling well.  Here is how we plan to help!  Based on what you have shared with me it looks like you have upper respiratory tract inflammation that has resulted in a significant cough.  Inflammation and infection in the upper respiratory tract is commonly called bronchitis and has four common causes:  Allergies, Viral Infections, Acid Reflux and Bacterial Infections.  Allergies, viruses and acid reflux are treated by controlling symptoms or eliminating the cause. An example might be a cough caused by taking certain blood pressure medications. You stop the cough by changing the medication. Another example might be a cough caused by acid reflux. Controlling the reflux helps control the cough.  Based on your presentation I believe you most likely have A cough due to a virus.  This is called viral bronchitis and is best treated by rest, plenty of fluids and control of the cough.  You may use Ibuprofen or Tylenol as directed to help your symptoms.    In addition you may use A non-prescription cough medication called Mucinex DM: take 2 tablets every 12 hours. and A prescription cough medication called Tessalon Perles 100mg. You may take 1-2 capsules every 8 hours as needed for your cough.    HOME CARE . Only take medications as instructed by your medical team. . Complete the entire course of an antibiotic. . Drink plenty of fluids and get plenty of rest. . Avoid close contacts especially the very young and the elderly . Cover your mouth if you cough or cough into your sleeve. . Always remember to wash your hands . A steam or ultrasonic humidifier can help congestion.    GET HELP RIGHT AWAY IF: . You develop worsening fever. . You become short of breath . You cough up blood. . Your symptoms persist after you have completed your treatment plan MAKE SURE YOU   Understand these instructions.  Will watch your condition.  Will get help right away if you are not doing  well or get worse.  Your e-visit answers were reviewed by a board certified advanced clinical practitioner to complete your personal care plan.  Depending on the condition, your plan could have included both over the counter or prescription medications. If there is a problem please reply  once you have received a response from your provider. Your safety is important to us.  If you have drug allergies check your prescription carefully.    You can use MyChart to ask questions about today's visit, request a non-urgent call back, or ask for a work or school excuse for 24 hours related to this e-Visit. If it has been greater than 24 hours you will need to follow up with your provider, or enter a new e-Visit to address those concerns. You will get an e-mail in the next two days asking about your experience.  I hope that your e-visit has been valuable and will speed your recovery. Thank you for using e-visits.   

## 2015-08-02 ENCOUNTER — Encounter: Payer: Self-pay | Admitting: Family Medicine

## 2015-08-02 MED ORDER — BUPROPION HCL ER (XL) 300 MG PO TB24: 300.0000 mg | ORAL_TABLET | Freq: Every day | ORAL | Status: DC

## 2015-08-09 ENCOUNTER — Telehealth: Payer: Self-pay | Admitting: Family Medicine

## 2015-08-09 NOTE — Telephone Encounter (Signed)
error 

## 2015-08-27 ENCOUNTER — Encounter: Payer: Self-pay | Admitting: Family Medicine

## 2015-08-27 MED ORDER — BUSPIRONE HCL 10 MG PO TABS: 10.0000 mg | ORAL_TABLET | Freq: Two times a day (BID) | ORAL | Status: DC

## 2015-09-20 ENCOUNTER — Telehealth: Payer: Self-pay | Admitting: Family Medicine

## 2015-09-20 MED ORDER — WELLBUTRIN XL 300 MG PO TB24: 300.0000 mg | ORAL_TABLET | Freq: Every day | ORAL | Status: DC

## 2015-09-20 NOTE — Telephone Encounter (Signed)
Brand name Wellbutrin XL

## 2015-09-21 MED ORDER — WELLBUTRIN XL 300 MG PO TB24: 300.0000 mg | ORAL_TABLET | Freq: Every day | ORAL | Status: DC

## 2015-09-21 MED FILL — WELLBUTRIN XL 300 MG TABLET: 300 | 30 days supply | Qty: 30 | Fill #0

## 2015-09-21 NOTE — Addendum Note (Signed)
Addended by: Laren BoomHOMMEL, Whitten Andreoni on: 09/21/2015 09:17 AM   Modules accepted: Orders

## 2015-10-19 MED FILL — WELLBUTRIN XL 300 MG TABLET: 300 | 30 days supply | Qty: 30 | Fill #1

## 2015-11-12 DIAGNOSIS — H5203 Hypermetropia, bilateral: Secondary | ICD-10-CM | POA: Diagnosis not present

## 2015-11-12 DIAGNOSIS — H527 Unspecified disorder of refraction: Secondary | ICD-10-CM | POA: Diagnosis not present

## 2015-11-18 MED FILL — WELLBUTRIN XL 300 MG TABLET: 300 | 30 days supply | Qty: 30 | Fill #2

## 2015-12-14 DIAGNOSIS — Z01419 Encounter for gynecological examination (general) (routine) without abnormal findings: Secondary | ICD-10-CM | POA: Diagnosis not present

## 2015-12-14 DIAGNOSIS — Z6826 Body mass index (BMI) 26.0-26.9, adult: Secondary | ICD-10-CM | POA: Diagnosis not present

## 2015-12-23 MED FILL — WELLBUTRIN XL 300 MG TABLET: 300 | 30 days supply | Qty: 30 | Fill #3

## 2015-12-29 ENCOUNTER — Encounter: Payer: Self-pay | Admitting: Family Medicine

## 2015-12-29 ENCOUNTER — Ambulatory Visit (INDEPENDENT_AMBULATORY_CARE_PROVIDER_SITE_OTHER): Payer: 59 | Admitting: Family Medicine

## 2015-12-29 VITALS — BP 106/73 | HR 73 | Wt 153.0 lb

## 2015-12-29 DIAGNOSIS — F411 Generalized anxiety disorder: Secondary | ICD-10-CM | POA: Diagnosis not present

## 2015-12-29 MED ORDER — WELLBUTRIN XL 150 MG PO TB24: 150.0000 mg | ORAL_TABLET | Freq: Every day | ORAL | 1 refills | Status: DC

## 2015-12-29 MED ORDER — WELLBUTRIN XL 300 MG PO TB24: 300.0000 mg | ORAL_TABLET | Freq: Every day | ORAL | 1 refills | Status: DC

## 2015-12-29 MED FILL — WELLBUTRIN XL 150 MG TABLET: 150 | 90 days supply | Qty: 90 | Fill #0

## 2015-12-29 NOTE — Progress Notes (Signed)
CC: Megan Roman is a 40 y.o. female is here for Medication Management   Subjective: HPI:   Follow-up anxiety: She takes the Wellbutrin has made a big improvement with her irritability and anxiety. She still can do there is a small room for improvement. Her ADHD symptoms have subsided since I saw her last. She denies any known side effects from the Wellbutrin. She denies any depression or distractibility. She is not really sure what's making her anxious still.   Review Of Systems Outlined In HPI  Past Medical History:  Diagnosis Date  . Asthma   . Seasonal allergies     Past Surgical History:  Procedure Laterality Date  . CESAREAN SECTION  10/2007  . CESAREAN SECTION  01/2012  . CESAREAN SECTION    . TUBAL LIGATION  01/2012   Family History  Problem Relation Age of Onset  . Alcohol abuse Paternal Grandfather   . Depression Mother   . Hypertension Mother   . Hyperlipidemia Mother   . Asthma Mother   . Diabetes      paternal grandparents  . Hyperlipidemia Father   . Hypertension Father     Social History   Social History  . Marital status: Married    Spouse name: N/A  . Number of children: N/A  . Years of education: N/A   Occupational History  . Not on file.   Social History Main Topics  . Smoking status: Never Smoker  . Smokeless tobacco: Not on file  . Alcohol use Yes     Comment: less than 1 a week  . Drug use: No  . Sexual activity: Yes    Partners: Male   Other Topics Concern  . Not on file   Social History Narrative  . No narrative on file     Objective: BP 106/73   Pulse 73   Wt 153 lb (69.4 kg)   BMI 25.86 kg/m   Vital signs reviewed. General: Alert and Oriented, No Acute Distress HEENT: Pupils equal, round, reactive to light. Conjunctivae clear.  External ears unremarkable.  Moist mucous membranes. Lungs: Clear and comfortable work of breathing, speaking in full sentences without accessory muscle use. Cardiac: Regular rate and  rhythm.  Neuro: CN II-XII grossly intact, gait normal. Extremities: No peripheral edema.  Strong peripheral pulses.  Mental Status: No depression, anxiety, nor agitation. Logical though process. Skin: Warm and dry. Assessment & Plan: [name] was seen today for medication management.  Diagnoses and all orders for this visit:  Generalized anxiety disorder  Other orders -     Discontinue: WELLBUTRIN XL 300 MG 24 hr tablet; Take 1 tablet (300 mg total) by mouth daily. Requesting Brand Name. -     WELLBUTRIN XL 150 MG 24 hr tablet; Take 1 tablet (150 mg total) by mouth daily. Take in addition to 300mg  capsule. -     WELLBUTRIN XL 300 MG 24 hr tablet; Take 1 tablet (300 mg total) by mouth daily. Requesting Brand Name.   Increasing Wellbutrin to 450 mg daily. Call if no benefit in one week  Return if symptoms worsen or fail to improve.

## 2016-01-18 DIAGNOSIS — N920 Excessive and frequent menstruation with regular cycle: Secondary | ICD-10-CM | POA: Diagnosis not present

## 2016-01-20 MED FILL — traMADol HCL 50 MG TABS: 50 | 3 days supply | Qty: 21 | Fill #0

## 2016-01-20 MED FILL — IBUPROFEN 800 MG TABLET: 800 | 10 days supply | Qty: 30 | Fill #0

## 2016-01-20 MED FILL — PROMETHAZINE 25 MG TABLET: 25 | 2 days supply | Qty: 10 | Fill #0

## 2016-01-28 DIAGNOSIS — N920 Excessive and frequent menstruation with regular cycle: Secondary | ICD-10-CM | POA: Diagnosis not present

## 2016-02-04 ENCOUNTER — Telehealth: Payer: 59 | Admitting: Family

## 2016-02-04 DIAGNOSIS — T8140XA Infection following a procedure, unspecified, initial encounter: Secondary | ICD-10-CM

## 2016-02-04 NOTE — Progress Notes (Signed)
Based on what you shared with me it looks like you have a serious condition that should be evaluated in a face to face office visit.  If you are having a true medical emergency please call 911.  If you need an urgent face to face visit, [facility] has four urgent care centers for your convenience.  If you need care fast and have a high deductible or no insurance consider:   [email or web address]  [phone]  [address], [address] [place], [place] [address] 8 am to 8 pm Monday-Friday 10 am to 4 pm Saturday-Sunday   The following sites will take your  insurance:    . [facility]  [phone] Get Driving Directions Find a Provider at this Location  [address] [place], [place] [address] . 10 am to 8 pm Monday-Friday . 12 pm to 8 pm Saturday-Sunday   . [facility]  [phone] Get Driving Directions Find a Provider at this Location  [address], [address] [place], [place] [address] . 8 am to 8 pm Monday-Friday . 9 am to 6 pm Saturday . 11 am to 6 pm Sunday   . [facility] [place]  [phone] Get Driving Directions  [address] [place], [place] [address] . 8 am to 8 pm Monday-Friday . 8 am to 4 pm Saturday-Sunday   . [facility] (walk-ins welcome, or call for a scheduled time)  [phone]  Get Driving Directions Find a Provider at this Location  [address] [place], [place] [address] . 8 am to 8:30 pm Monday-Thursday . 8 am to 6 pm Friday . 8 am to 4 pm Saturday-Sunday   Your e-visit answers were reviewed by a board certified advanced clinical practitioner to complete your personal care plan.  Thank you for using e-Visits.  

## 2016-02-06 ENCOUNTER — Encounter: Payer: Self-pay | Admitting: Emergency Medicine

## 2016-02-06 ENCOUNTER — Emergency Department
Admission: EM | Admit: 2016-02-06 | Discharge: 2016-02-06 | Disposition: A | Discharge status: Home / Self Care | Payer: 59 | Source: Home / Self Care | Attending: Family Medicine | Admitting: Family Medicine

## 2016-02-06 DIAGNOSIS — R350 Frequency of micturition: Secondary | ICD-10-CM | POA: Diagnosis not present

## 2016-02-06 DIAGNOSIS — R300 Dysuria: Secondary | ICD-10-CM | POA: Diagnosis not present

## 2016-02-06 LAB — POCT URINALYSIS DIP (MANUAL ENTRY)
Bilirubin, UA: NEGATIVE
Glucose, UA: NEGATIVE
Ketones, POC UA: NEGATIVE
Nitrite, UA: NEGATIVE
Protein Ur, POC: 30 — AB
Spec Grav, UA: 1.02
Urobilinogen, UA: 0.2
pH, UA: 7

## 2016-02-06 MED ORDER — CEPHALEXIN 500 MG PO CAPS: 500.0000 mg | ORAL_CAPSULE | Freq: Two times a day (BID) | ORAL | 0 refills | Status: DC

## 2016-02-06 NOTE — ED Provider Notes (Signed)
CSN: [id number]     Arrival date & time 02/06/16  1201 History   First MD Initiated Contact with Patient 02/06/16 1228     Chief Complaint  Patient presents with  . Urinary Tract Infection   (Consider location/radiation/quality/duration/timing/severity/associated sxs/prior Treatment) HPI Megan Roman is a 40 y.o. female presenting to UC with c/o gradually worsening urinary frequency, urgency and dysuria for about 2 days.  Pt had a uterine ablation on 01/28/16.  She notes she felt great the next day and most of the week as if she never had a procedure done.  Symptoms then started with abdominal pressure and malodorous urine with hematuria. Pt concerned for a UTI as she is not having any vaginal symptoms and she was not warned about current symptoms being related to recent procedure.  Denies fever, chills, n/v/d. Denies back pain. She did take a home urine test which indicated she did have a UTI.   Past Medical History:  Diagnosis Date  . Asthma   . Seasonal allergies    Past Surgical History:  Procedure Laterality Date  . ABLATION    . CESAREAN SECTION  10/2007  . CESAREAN SECTION  01/2012  . CESAREAN SECTION    . TUBAL LIGATION  01/2012   Family History  Problem Relation Age of Onset  . Alcohol abuse Paternal Grandfather   . Depression Mother   . Hypertension Mother   . Hyperlipidemia Mother   . Asthma Mother   . Diabetes      paternal grandparents  . Hyperlipidemia Father   . Hypertension Father    Social History  Substance Use Topics  . Smoking status: Never Smoker  . Smokeless tobacco: Never Used  . Alcohol use Yes     Comment: less than 1 a week   OB History    No data available     Review of Systems  Constitutional: Negative for chills and fever.  Gastrointestinal: Positive for abdominal pain ( lower). Negative for diarrhea, nausea and vomiting.  Genitourinary: Positive for dysuria, frequency, hematuria, pelvic pain (worse on Right side) and urgency.  Negative for decreased urine volume, flank pain, vaginal bleeding, vaginal discharge and vaginal pain.  Musculoskeletal: Negative for back pain and myalgias.    Allergies  Buspar [buspirone] and Celexa [citalopram hydrobromide]  Home Medications   Prior to Admission medications   Medication Sig Start Date End Date Taking? Authorizing Provider  traMADol (ULTRAM) 50 MG tablet Take by mouth every 6 (six) hours as needed.   Yes Historical Provider, MD  cephALEXin (KEFLEX) 500 MG capsule Take 1 capsule (500 mg total) by mouth 2 (two) times daily. 02/06/16   Junius FinnerErin O'Malley, PA-C  WELLBUTRIN XL 150 MG 24 hr tablet Take 1 tablet (150 mg total) by mouth daily. Take in addition to 300mg  capsule. 12/29/15   Sean Hommel, DO  WELLBUTRIN XL 300 MG 24 hr tablet Take 1 tablet (300 mg total) by mouth daily. Requesting Brand Name. 12/29/15   Laren BoomSean Hommel, DO   Meds Ordered and Administered this Visit  Medications - No data to display  BP 105/71 (BP Location: Left Arm)   Pulse 79   Temp 98.1 F (36.7 C) (Oral)   Ht 5\' 4"  (1.626 m)   Wt 151 lb 8 oz (68.7 kg)   LMP 01/20/2016 (Exact Date)   SpO2 98%   BMI 26.00 kg/m  No data found.   Physical Exam  Constitutional: She appears well-developed and well-nourished. No distress.  HENT:  Head:  Normocephalic and atraumatic.  Mouth/Throat: Oropharynx is clear and moist.  Eyes: Conjunctivae are normal. No scleral icterus.  Neck: Normal range of motion.  Cardiovascular: Normal rate, regular rhythm and normal heart sounds.   Pulmonary/Chest: Effort normal and breath sounds normal. No respiratory distress. She has no wheezes. She has no rales.  Abdominal: Soft. She exhibits no distension and no mass. There is tenderness. There is no rebound, no guarding and no CVA tenderness.    Tenderness along lower abdomen w/o rebound or guarding. No masses palpated.  Musculoskeletal: Normal range of motion.  Neurological: She is alert.  Skin: Skin is warm and dry. She is  not diaphoretic.  Nursing note and vitals reviewed.   Urgent Care Course   Clinical Course    Procedures (including critical care time)  Labs Review Labs Reviewed  POCT URINALYSIS DIP (MANUAL ENTRY) - Abnormal; Notable for the following:       Result Value   Blood, UA trace-intact (*)    Protein Ur, POC =30 (*)    Leukocytes, UA Trace (*)    All other components within normal limits  URINE CULTURE    Imaging Review No results found.    MDM   1. Dysuria   2. Urinary frequency    Pt c/o urinary symptoms that started about 1 week after she had a uterine ablation. Home urine test indicated she did have a UTI.  UA in UC: c/w possible mild UTI, will send culture.  Discussed results with pt as POC UA not as accurate as culture and does not indicate a definite UTI.  However, due to recent procedure and c/o pain with urination, will agree to start pt on antibiotics- Keflex, while culture is pending. Encouraged to f/u with PCP or OB/GYN later this week if not improving, sooner if worsening. Pt will be notified as soon as culture results come back. Encouraged to stay well hydrated.     Junius Finner, PA-C 02/06/16 1245

## 2016-02-06 NOTE — ED Triage Notes (Signed)
Pt states she had a uterine ablation on 01/28/2016 and started having urgency, dysuria and frequency.  A lot of abdominal pressure, some odor, hematuria.

## 2016-02-08 LAB — URINE CULTURE

## 2016-02-10 ENCOUNTER — Telehealth: Payer: Self-pay | Admitting: Emergency Medicine

## 2016-02-10 NOTE — Telephone Encounter (Signed)
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

## 2016-02-21 MED FILL — WELLBUTRIN XL 300 MG TABLET: 300 | 90 days supply | Qty: 90 | Fill #0

## 2016-02-22 DIAGNOSIS — R300 Dysuria: Secondary | ICD-10-CM | POA: Diagnosis not present

## 2016-02-22 MED FILL — metroNIDAZOLE 500 MG TABS: 500 | 5 days supply | Qty: 10 | Fill #0

## 2016-03-06 ENCOUNTER — Ambulatory Visit (INDEPENDENT_AMBULATORY_CARE_PROVIDER_SITE_OTHER): Payer: 59 | Admitting: Osteopathic Medicine

## 2016-03-06 ENCOUNTER — Encounter: Payer: Self-pay | Admitting: Osteopathic Medicine

## 2016-03-06 VITALS — BP 111/64 | HR 72 | Ht 64.5 in | Wt 154.0 lb

## 2016-03-06 DIAGNOSIS — F411 Generalized anxiety disorder: Secondary | ICD-10-CM | POA: Diagnosis not present

## 2016-03-06 DIAGNOSIS — J069 Acute upper respiratory infection, unspecified: Secondary | ICD-10-CM

## 2016-03-06 DIAGNOSIS — B9789 Other viral agents as the cause of diseases classified elsewhere: Secondary | ICD-10-CM

## 2016-03-06 DIAGNOSIS — R41840 Attention and concentration deficit: Secondary | ICD-10-CM

## 2016-03-06 MED ORDER — BUSPIRONE HCL 10 MG PO TABS: 5.0000 mg | ORAL_TABLET | Freq: Two times a day (BID) | ORAL | 1 refills | Status: DC

## 2016-03-06 NOTE — Patient Instructions (Addendum)
   TYLENOL 1000 mg every 6 hours -  maximum dose 4g daily  BENADRYL +/- SUDAFED   +/- IBUPROFEN 800 mg every 6 hours - maximum 3600 mg daily  NASAL SPRAY: CROMOLYN, +/- SPARING USE OF OXYMETOLAZONE  COUGH  DEXTROMETHORPHAN (COUGH SUPPRESSANT)   +/- GUAIFENISEN (EXPECTORANT)  HONEY  LONZENGES W/ BENZOCAINE + MENTHOL (CEPACOL),   TEA W/ ELM BARK/LICORICE ROOT/MARSHMALLOW ROOT  +/- ZINC LOZENGES W/IN 24 HR OF SYMPTOMS  NO VITAMIN C OR ECHINACEA - waste of $

## 2016-03-06 NOTE — Progress Notes (Signed)
HPI: Megan Roman is a 40 y.o. female  who [facility] [place] [facility], 03/06/[facility],  for chief complaint of:  Chief Complaint  Patient presents with  . Establish Care    Anxiety: Patient has been on max dose of Wellbutrin, she went back to dose of 300 mg not noticing much difference from 450. Overall doing okay on this medicine but still reports some fatigue, significant concentration problems. Daughter diagnosed with ADHD, patient thinks that she may suffer from similar problem. No previous workup or treatment. Referral to be made in the past by previous PCP Dr. Ivan AnchorsHommel to behavioral health for evaluation but were never completed. Previously on Celexa, BuSpar, Zoloft. States that she did okay on the buspirone, this is listed in her allergies as intolerance causing nausea, patient thinks this is because she was pregnant at the time. Would like to try getting back on this medication.  Reports viral URI symptoms, would like to know what she can take over-the-counter to avoid needing antibiotics.   Past medical, surgical, social and family history reviewed: Past Medical History:  Diagnosis Date  . Asthma   . Seasonal allergies    Past Surgical History:  Procedure Laterality Date  . ABLATION    . CESAREAN SECTION  10/2007  . CESAREAN SECTION  01/2012  . CESAREAN SECTION    . TUBAL LIGATION  01/2012   Social History  Substance Use Topics  . Smoking status: Never Smoker  . Smokeless tobacco: Never Used  . Alcohol use Yes     Comment: less than 1 a week   Family History  Problem Relation Age of Onset  . Alcohol abuse Paternal Grandfather   . Depression Mother   . Hypertension Mother   . Hyperlipidemia Mother   . Asthma Mother   . Diabetes      paternal grandparents  . Hyperlipidemia Father   . Hypertension Father      Current medication list and allergy/intolerance information reviewed:   Current Outpatient Prescriptions on File Prior to  Visit  Medication Sig Dispense Refill  . WELLBUTRIN XL 150 MG 24 hr tablet Take 1 tablet (150 mg total) by mouth daily. Take in addition to 300mg  capsule. 90 tablet 1  . WELLBUTRIN XL 300 MG 24 hr tablet Take 1 tablet (300 mg total) by mouth daily. Requesting Brand Name. 90 tablet 1   No current facility-administered medications on file prior to visit.    Allergies  Allergen Reactions  . Buspar [Buspirone]     nausea  . Celexa [Citalopram Hydrobromide]     nausea      Review of Systems:  Constitutional: +recent illness  HEENT: No  headache, no vision change  Cardiac: No  chest pain, No  pressure, No palpitations  Respiratory:  No  shortness of breath. No  Cough  Gastrointestinal: No  abdominal pain, no change on bowel habits  Musculoskeletal: No new myalgia/arthralgia  Skin: No  Rash  Hem/Onc: No  easy bruising/bleeding, No  abnormal lumps/bumps  Neurologic: No  weakness, No  Dizziness  Psychiatric: No  concerns with depression, +concerns with anxiety  Exam:  BP 111/64   Pulse 72   Ht 5' 4.5" (1.638 m)   Wt 154 lb (69.9 kg)   LMP 01/20/2016 (Exact Date)   BMI 26.03 kg/m   Constitutional: VS see above. General Appearance: alert, well-developed, well-nourished, NAD  Eyes: Normal lids and conjunctive, non-icteric sclera  Ears, Nose, Mouth, Throat: MMM, Normal external  inspection ears/nares/mouth/lips/gums.  Neck: No masses, trachea midline.   Respiratory: Normal respiratory effort. no wheeze, no rhonchi, no rales  Cardiovascular: S1/S2 normal, no murmur, no rub/gallop auscultated. RRR.   Musculoskeletal: Gait normal. Symmetric and independent movement of all extremities  Neurological: Normal balance/coordination. No tremor.  Skin: warm, dry, intact.   Psychiatric: Normal judgment/insight. Normal mood and affect. Oriented x3.      ASSESSMENT/PLAN: Patient is advised to let us know if she doesn't hear back about formal ADHD evaluation. In the meantime,  we'll restart BuSpar per patient request, advised may be more beneficial for her to take this twice a day, start at low dose and consider increased based on side effects/response. Over-the-counter medication list given for viral URI symptoms  Anxiety state - Plan: busPIRone (BUSPAR) 10 MG tablet, Ambulatory referral to Behavioral Health  Concentration deficit - Plan: Ambulatory referral to Behavioral Health  Viral URI    Patient Instructions   TYLENOL 1000 mg every 6 hours -  maximum dose 4g daily  BENADRYL +/- SUDAFED   +/- IBUPROFEN 800 mg every 6 hours - maximum 3600 mg daily  NASAL SPRAY: CROMOLYN, +/- SPARING USE OF OXYMETOLAZONE  COUGH: DEXTROMETHORPHAN (COUGH SUPPRESSANT) +/- GUAIFENISEN (EXPECTORANT), HONEY, LONZENGES W/ BENZOCAINE + MENTHOL, TEA W/ ELM BARK/LICORICE ROOT/MARSHMALLOW ROOT  +/- ZINC LOZENGES W/IN 24 HR OF SYMPTOMS  NO VITAMIN C OR ECHINACEA - waste of $     Visit summary with medication list and pertinent instructions WAS printed for patient to review. All questions at time of visit were answered - patient instructed to contact office with any additional concerns. ER/RTC precautions were reviewed with the patient. Follow-up plan: Return for discussion of medicatioins for anxiety or possible ADHD after your ADHD evaluation .  Note: Total time spent 25 minutes, greater than 50% of the visit was spent face-to-face counseling and coordinating care for the following: The primary encounter diagnosis was Anxiety state. Diagnoses of Concentration deficit and Viral URI were also pertinent to this visit..Marland Kitchen

## 2016-04-28 ENCOUNTER — Encounter: Payer: Self-pay | Admitting: Osteopathic Medicine

## 2016-05-22 ENCOUNTER — Encounter: Payer: Self-pay | Admitting: Osteopathic Medicine

## 2016-05-31 MED FILL — busPIRone HCL 10 MG TABS: 10 | 30 days supply | Qty: 60 | Fill #0

## 2016-07-12 ENCOUNTER — Encounter: Payer: Self-pay | Admitting: Osteopathic Medicine

## 2016-07-12 ENCOUNTER — Ambulatory Visit (INDEPENDENT_AMBULATORY_CARE_PROVIDER_SITE_OTHER): Payer: 59 | Admitting: Osteopathic Medicine

## 2016-07-12 VITALS — BP 118/76 | HR 78 | Ht 64.5 in | Wt 157.0 lb

## 2016-07-12 DIAGNOSIS — R5382 Chronic fatigue, unspecified: Secondary | ICD-10-CM | POA: Diagnosis not present

## 2016-07-12 DIAGNOSIS — Z1389 Encounter for screening for other disorder: Secondary | ICD-10-CM

## 2016-07-12 DIAGNOSIS — Z0000 Encounter for general adult medical examination without abnormal findings: Secondary | ICD-10-CM

## 2016-07-12 DIAGNOSIS — R41840 Attention and concentration deficit: Secondary | ICD-10-CM | POA: Diagnosis not present

## 2016-07-12 DIAGNOSIS — F411 Generalized anxiety disorder: Secondary | ICD-10-CM | POA: Diagnosis not present

## 2016-07-12 DIAGNOSIS — Z1331 Encounter for screening for depression: Secondary | ICD-10-CM

## 2016-07-12 DIAGNOSIS — F3281 Premenstrual dysphoric disorder: Secondary | ICD-10-CM | POA: Diagnosis not present

## 2016-07-12 LAB — CBC WITH DIFFERENTIAL/PLATELET
Basophils Absolute: 0 cells/uL (ref 0–200)
Basophils Relative: 0 %
EOS PCT: 5 %
Eosinophils Absolute: 305 cells/uL (ref 15–500)
HEMATOCRIT: 41.6 % (ref 35.0–45.0)
HEMOGLOBIN: 14 g/dL (ref 11.7–15.5)
LYMPHS ABS: 1525 {cells}/uL (ref 850–3900)
Lymphocytes Relative: 25 %
MCH: 32.9 pg (ref 27.0–33.0)
MCHC: 33.7 g/dL (ref 32.0–36.0)
MCV: 97.9 fL (ref 80.0–100.0)
MONO ABS: 366 {cells}/uL (ref 200–950)
MPV: 10.7 fL (ref 7.5–12.5)
Monocytes Relative: 6 %
Neutro Abs: 3904 cells/uL (ref 1500–7800)
Neutrophils Relative %: 64 %
Platelets: 260 10*3/uL (ref 140–400)
RBC: 4.25 MIL/uL (ref 3.80–5.10)
RDW: 13.6 % (ref 11.0–15.0)
WBC: 6.1 10*3/uL (ref 3.8–10.8)

## 2016-07-12 LAB — COMPLETE METABOLIC PANEL WITH GFR
ALBUMIN: 4.1 g/dL (ref 3.6–5.1)
ALK PHOS: 39 U/L (ref 33–115)
ALT: 18 U/L (ref 6–29)
AST: 16 U/L (ref 10–30)
BILIRUBIN TOTAL: 0.5 mg/dL (ref 0.2–1.2)
BUN: 15 mg/dL (ref 7–25)
CO2: 29 mmol/L (ref 20–31)
CREATININE: 0.78 mg/dL (ref 0.50–1.10)
Calcium: 9.8 mg/dL (ref 8.6–10.2)
Chloride: 107 mmol/L (ref 98–110)
GFR, Est African American: >89 mL/min (ref >=60)
GFR, Est Non African American: >89 mL/min (ref >=60)
GLUCOSE: 91 mg/dL (ref 65–99)
POTASSIUM: 4.6 mmol/L (ref 3.5–5.3)
SODIUM: 142 mmol/L (ref 135–146)
TOTAL PROTEIN: 6.6 g/dL (ref 6.1–8.1)

## 2016-07-12 LAB — LIPID PANEL
CHOL/HDL RATIO: 2.9 ratio (ref <5.0)
Cholesterol: 178 mg/dL (ref <200)
HDL: 62 mg/dL (ref >50)
LDL Cholesterol: 104 mg/dL — ABNORMAL HIGH (ref <100)
Triglycerides: 60 mg/dL (ref <150)
VLDL: 12 mg/dL (ref <30)

## 2016-07-12 LAB — MAGNESIUM: MAGNESIUM: 2.3 mg/dL (ref 1.5–2.5)

## 2016-07-12 LAB — TSH: TSH: 1.43 m[IU]/L

## 2016-07-12 NOTE — Progress Notes (Signed)
HPI: Megan Roman is a 41 y.o. female  who presents to [facility] [place] today, 07/12/16,  for chief complaint of:  Chief Complaint  Patient presents with  . Annual Exam    Patient here today for her annual physical exam. See below for review of preventive care.  Additional problems reviewed:  Significant fatigue/anxiety & irritability  Stopped Wellbutrin - didn't seem to be making any difference. Multiple other medications in the past.   PMDD issues seem to be getting worse. Fatigue seriously worse before "periods" (Hx ablation so no longer has menstrual bleeding but notices cycle particularly with fatigue)  Getting tested for ADHD 08/29/16  Life stressors: work, daughter w/ ADHD  Increased magnesium and B vitamins have helped a bit      Past medical, surgical, social and family history reviewed: Patient Active Problem List   Diagnosis Date Noted  . Generalized anxiety disorder 07/07/2015  . PMDD (premenstrual dysphoric disorder) 04/09/2013  . 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 ABLATION    . CESAREAN SECTION  10/2007  . CESAREAN SECTION  01/2012  . CESAREAN SECTION    . TUBAL LIGATION  01/2012   Social History  Substance Use Topics  . Smoking status: Never Smoker  . Smokeless tobacco: Never Used  . Alcohol use Yes     Comment: less than 1 a week   Family History  Problem Relation Age of Onset  . Alcohol abuse Paternal Grandfather   . Depression Mother   . Hypertension Mother   . Hyperlipidemia Mother   . Asthma Mother   . Diabetes      paternal grandparents  . Hyperlipidemia Father   . Hypertension Father      Current medication list and allergy/intolerance information reviewed:   Current Outpatient Prescriptions  Medication Sig Dispense Refill  . busPIRone (BUSPAR) 10 MG tablet Take 0.5-1 tablets (5-10 mg total) by mouth 2 (two) times daily. (Patient  not taking: Reported on 07/12/2016) 60 tablet 1  . WELLBUTRIN XL 300 MG 24 hr tablet Take 1 tablet (300 mg total) by mouth daily. Requesting Brand Name. (Patient not taking: Reported on 07/12/2016) 90 tablet 1   No current facility-administered medications for this visit.    Allergies  Allergen Reactions  . Celexa [Citalopram Hydrobromide]     nausea  . Buspar [Buspirone]     nausea      Review of Systems:  Constitutional:  No  fever, no chills, No recent illness, No unintentional weight changes. +significant fatigue.   HEENT: No  headache, no vision change  Cardiac: No  chest pain, No  pressure, No palpitations, No  Orthopnea  Respiratory:  No  shortness of breath. No  Cough  Gastrointestinal: No  abdominal pain, No  nausea  Musculoskeletal: No new myalgia/arthralgia  Genitourinary: No  incontinence, No  abnormal genital bleeding, No abnormal genital discharge  Skin: No  Rash  Neurologic: No  weakness, No  dizziness  Psychiatric: No  concerns with depression, +concerns with anxiety, +sleep problems, +mood problems  Exam:  BP 118/76   Pulse 78   Ht 5' 4.5" (1.638 m)   Wt 157 lb (71.2 kg)   BMI 26.53 kg/m   Constitutional: VS see above. General Appearance: alert, well-developed, well-nourished, NAD  Eyes: Normal lids and conjunctive, non-icteric sclera  Ears, Nose, Mouth, Throat: MMM, Normal external inspection ears/nares/mouth/lips/gums. TM normal bilaterally. Pharynx/tonsils no erythema, no exudate. Nasal  mucosa normal.   Neck: No masses, trachea midline. No thyroid enlargement. No tenderness/mass appreciated. No lymphadenopathy  Respiratory: Normal respiratory effort. no wheeze, no rhonchi, no rales  Cardiovascular: S1/S2 normal, no murmur, no rub/gallop auscultated. RRR. No lower extremity edema.   Gastrointestinal: Nontender, no masses. No hepatomegaly, no splenomegaly. No hernia appreciated. Bowel sounds normal. Rectal exam deferred.   Musculoskeletal:  Gait normal. No clubbing/cyanosis of digits.   Neurological: Normal balance/coordination. No tremor. No cranial nerve deficit on limited exam. Motor and sensation intact and symmetric. Cerebellar reflexes intact.   Skin: warm, dry, intact. No rash/ulcer. No concerning nevi or subq nodules on limited exam.    Psychiatric: Normal judgment/insight. Normal mood and affect. Oriented x3.      ASSESSMENT/PLAN:  The primary encounter diagnosis was Annual physical exam. Diagnoses of Chronic fatigue, PMDD (premenstrual dysphoric disorder), Generalized anxiety disorder, Attention and concentration deficit, and Positive depression screening were also pertinent to this visit.  Annual physical: Patient up-to-date on major preventive health measures. Follows with OB/GYN for well woman care, no records currently available at this time.  Fatigue, PMDD, GAD, questionable ADD: Patient has upcoming appointment with behavioral health for further evaluation. She is also interested in possible alternative medicine therapy w/ Roni Bread which was encouraged. Advised optimization of diet/exercise regimen to improve overall mood and energy   Orders Placed This Encounter  Procedures  . CBC with Differential/Platelet  . COMPLETE METABOLIC PANEL WITH GFR  . Lipid panel  . TSH  . VITAMIN D 25 Hydroxy (Vit-D Deficiency, Fractures)  . Magnesium  . Vitamin B12     FEMALE PREVENTIVE CARE Updated 07/12/16   ANNUAL SCREENING/COUNSELING  Diet/Exercise - HEALTHY HABITS DISCUSSED TO DECREASE CV RISK History  Smoking Status  . Never Smoker  Smokeless Tobacco  . Never Used   History  Alcohol Use  . Yes    Comment: less than 1 a week   Depression screen [facility] 2/9 07/12/2016  Decreased Interest 2  Down, Depressed, Hopeless 2  PHQ - 2 Score 4  Altered sleeping 0  Tired, decreased energy 2  Change in appetite 2  Feeling bad or failure about yourself  2  Trouble concentrating 2  Moving slowly or  fidgety/restless 2  Suicidal thoughts 0  PHQ-9 Score 14    Domestic violence concerns - no  HTN SCREENING - SEE VITALS  SEXUAL HEALTH  Sexually active in the past year - Yes with female.  Need/want STI testing today? - no  Concerns about libido or pain with sex? - no  INFECTIOUS DISEASE SCREENING  HIV - needs  GC/CT - does not need  HepC - DOB [birth year] - does not need  TB - does not need  DISEASE SCREENING  Lipid - needs  DM2 - needs  CANCER SCREENING  Cervical - does not need  Breast - does not need  Lung - does not need  Colon - does not need  ADULT VACCINATION  Influenza - annual vaccine recommended  Td - booster every 10 years   Zoster - option at 50, yes at 60+   PCV13 - was not indicated  PPSV23 - was not indicated Immunization History  Administered Date(s) Administered  . Influenza-Unspecified 01/29/2014, 02/13/2016  . Tdap 05/01/2012        Visit summary with medication list and pertinent instructions was printed for patient to review. All questions at time of visit were answered - patient instructed to contact office with any additional concerns. ER/RTC precautions were  reviewed with the patient. Follow-up plan: Return for medication if needed following behavioral health evaluation .

## 2016-07-13 LAB — VITAMIN D 25 HYDROXY (VIT D DEFICIENCY, FRACTURES): VIT D 25 HYDROXY: 37 ng/mL (ref 30–100)

## 2016-07-20 ENCOUNTER — Encounter: Payer: Self-pay | Admitting: Osteopathic Medicine

## 2016-07-27 ENCOUNTER — Other Ambulatory Visit: Payer: Self-pay | Admitting: Osteopathic Medicine

## 2016-07-27 DIAGNOSIS — Z1321 Encounter for screening for nutritional disorder: Secondary | ICD-10-CM | POA: Diagnosis not present

## 2016-07-27 LAB — VITAMIN B12: VITAMIN B 12: 660 pg/mL (ref 200–1100)

## 2016-08-29 ENCOUNTER — Ambulatory Visit (INDEPENDENT_AMBULATORY_CARE_PROVIDER_SITE_OTHER): Payer: 59 | Admitting: Psychology

## 2016-08-29 DIAGNOSIS — F329 Major depressive disorder, single episode, unspecified: Secondary | ICD-10-CM

## 2016-08-29 DIAGNOSIS — F900 Attention-deficit hyperactivity disorder, predominantly inattentive type: Secondary | ICD-10-CM | POA: Diagnosis not present

## 2016-08-29 DIAGNOSIS — F411 Generalized anxiety disorder: Secondary | ICD-10-CM

## 2016-08-29 DIAGNOSIS — F3289 Other specified depressive episodes: Secondary | ICD-10-CM | POA: Diagnosis not present

## 2016-08-30 ENCOUNTER — Encounter: Payer: Self-pay | Admitting: Osteopathic Medicine

## 2016-09-05 MED FILL — busPIRone HCL 10 MG TABS: 10 | 30 days supply | Qty: 60 | Fill #1

## 2016-09-06 ENCOUNTER — Ambulatory Visit (INDEPENDENT_AMBULATORY_CARE_PROVIDER_SITE_OTHER): Payer: 59 | Admitting: Osteopathic Medicine

## 2016-09-06 ENCOUNTER — Encounter: Payer: Self-pay | Admitting: Osteopathic Medicine

## 2016-09-06 ENCOUNTER — Telehealth: Payer: Self-pay | Admitting: Osteopathic Medicine

## 2016-09-06 VITALS — BP 121/73 | HR 92 | Ht 64.5 in | Wt 155.0 lb

## 2016-09-06 DIAGNOSIS — R5383 Other fatigue: Secondary | ICD-10-CM | POA: Diagnosis not present

## 2016-09-06 DIAGNOSIS — R41840 Attention and concentration deficit: Secondary | ICD-10-CM

## 2016-09-06 DIAGNOSIS — F411 Generalized anxiety disorder: Secondary | ICD-10-CM

## 2016-09-06 NOTE — Telephone Encounter (Signed)
Can we see if we can get records from most recent visit with behavioral health/ADHD evaluation:   Altabet, Salvatore DecentSteven C, PhD [facility] [phone] phone  Thanks

## 2016-09-06 NOTE — Progress Notes (Signed)
HPI: Megan Roman is a [age] y.o. female  who presents to [facility] [place] today, 09/06/16,  for chief complaint of:  Chief Complaint  Patient presents with  . Follow-up    ANXIETY AND BEHAVIORAL HEALTH ISSUES    Patient here today for her annual physical exam. See below for review of preventive care.   Additional problems reviewed:  Multiple medications in the past. Wellbutrin - didn't seem to be making any difference. Buspar had worked in the past but lately not helpful, caused sedation. Zoloft caused sleepiness, tried this a few times. Celexa and Lexapro caused significant nausea.   PMDD issues seem to be getting worse. Fatigue seriously worse before "periods" (Hx ablation so no longer has menstrual bleeding but notices cycle particularly with fatigue)  Eval for ADHD 08/29/16: need records but (+)report per patient   Life stressors: work, daughter w/ ADHD - has been on Vyvanse, Contempla.   Increased magnesium and B vitamins have helped a bit   Difficulty waking up in the morning, difficulty getting to sleep. Significant fatigue/anxiety & irritability.      Past medical, surgical, social and family history reviewed: Patient Active Problem List   Diagnosis Date Noted  . Generalized anxiety disorder 07/07/2015  . PMDD (premenstrual dysphoric disorder) 04/09/2013  . 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 ABLATION    . CESAREAN SECTION  10/2007  . CESAREAN SECTION  01/2012  . CESAREAN SECTION    . TUBAL LIGATION  01/2012   Social History  Substance Use Topics  . Smoking status: Never Smoker  . Smokeless tobacco: Never Used  . Alcohol use Yes     Comment: less than 1 a week   Family History  Problem Relation Age of Onset  . Alcohol abuse Paternal Grandfather   . Depression Mother   . Hypertension Mother   . Hyperlipidemia Mother   . Asthma Mother   . Diabetes     paternal grandparents  . Hyperlipidemia Father   . Hypertension Father      Current medication list and allergy/intolerance information reviewed:   No current outpatient prescriptions on file.   No current facility-administered medications for this visit.    Allergies  Allergen Reactions  . Celexa [Citalopram Hydrobromide]     nausea  . Buspar [Buspirone]     nausea      Review of Systems:  Constitutional:  No recent illness, No unintentional weight changes. +significant fatigue.   HEENT: No  headache, no vision change  Cardiac: No  chest pain,  Respiratory:  No  shortness of breath.  Psychiatric: No  concerns with depression, +concerns with anxiety, +sleep problems, +mood problems  Exam:  BP 121/73   Pulse 92   Ht 5' 4.5" (1.638 m)   Wt 155 lb (70.3 kg)   BMI 26.19 kg/m   Constitutional: VS see above. General Appearance: alert, well-developed, well-nourished, NAD  Respiratory: Normal respiratory effort.   Musculoskeletal: Gait normal. No clubbing/cyanosis of digits.   Neurological: Normal balance/coordination. No tremor.   Psychiatric: Normal judgment/insight. Normal mood and affect. Oriented x3.      ASSESSMENT/PLAN: The primary encounter diagnosis was Attention and concentration deficit. Diagnoses of Generalized anxiety disorder and Fatigue, unspecified type were also pertinent to this visit.  Fatigue, PMDD, GAD, ADD: Need records from behavioral health. Ordered [employer] genetic testing today. She is also interested in possible alternative medicine therapy w/ Roni Breadobin Hood which  was encouraged. Advised optimization of diet/exercise regimen to improve overall mood and energy  Patient Instructions  Plan:  [facility] testing now  Will call you when results are in then let's schedule a visit to discuss results and come up with a treatment plan  In the meantime will get official testing report from Dr. Reggy Eye   Call us with any questions!    Visit  summary with medication list and pertinent instructions was printed for patient to review. All questions at time of visit were answered - patient instructed to contact office with any additional concerns. ER/RTC precautions were reviewed with the patient. Follow-up plan: Return for recheck anxiety/ADD/fatigue once test results are in .   Total time spent 15 minutes, greater than 50% of the visit was face to face counseling and coordinating care for diagnosis as noted above.

## 2016-09-06 NOTE — Telephone Encounter (Signed)
Called to get fax number but was unable to get in touch with anybody. Will try back later. Dvante Hands,CMA

## 2016-09-06 NOTE — Patient Instructions (Signed)
Plan:  [facility] testing now  Will call you when results are in then let's schedule a visit to discuss results and come up with a treatment plan  In the meantime will get official testing report from Dr. Reggy EyeAltabet   Call us with any questions!

## 2016-09-08 NOTE — Telephone Encounter (Signed)
Request has been faxed to [facility] @ [phone]. Rhonda Cunningham,CMA

## 2016-09-18 ENCOUNTER — Encounter: Payer: Self-pay | Admitting: Osteopathic Medicine

## 2016-09-19 NOTE — Telephone Encounter (Signed)
[facility]ed [facility], the official report is not complete yet. The nurse sent a note to the Provider to get a summary faxed over in the meantime.

## 2016-09-26 ENCOUNTER — Encounter: Payer: Self-pay | Admitting: Osteopathic Medicine

## 2016-09-26 ENCOUNTER — Ambulatory Visit (INDEPENDENT_AMBULATORY_CARE_PROVIDER_SITE_OTHER): Payer: 59 | Admitting: Osteopathic Medicine

## 2016-09-26 VITALS — BP 106/69 | HR 82 | Ht 64.0 in | Wt 157.0 lb

## 2016-09-26 DIAGNOSIS — F3281 Premenstrual dysphoric disorder: Secondary | ICD-10-CM

## 2016-09-26 DIAGNOSIS — F411 Generalized anxiety disorder: Secondary | ICD-10-CM | POA: Diagnosis not present

## 2016-09-26 DIAGNOSIS — R41840 Attention and concentration deficit: Secondary | ICD-10-CM | POA: Diagnosis not present

## 2016-09-26 MED ORDER — DEXMETHYLPHENIDATE HCL 5 MG PO TABS: 5.0000 mg | ORAL_TABLET | Freq: Two times a day (BID) | ORAL | 0 refills | Status: DC

## 2016-09-26 MED FILL — DEXMETHYLPHENIDATE 5 MG TAB: 5 | 30 days supply | Qty: 60 | Fill #0

## 2016-09-26 NOTE — Progress Notes (Signed)
HPI: Megan Roman is a 41 y.o. female  who presents to [facility] [place] today, 09/26/16,  for chief complaint of:  Chief Complaint  Patient presents with  . Follow-up    GENETIC TESTING    Attention deficit with comorbid anxiety/depression. Recently evaluated by behavioral health and here to discuss any medications. Has had problems with ADHD medicines in the past, her daughter has history of similar. Based on results of elective pharmacologenomic testing, most stimulant and non-stimulant medications have possible problems.  Dexmethylphenidate and methylphenidate are associated with COMT genotype which can cause reduced therapeutic response to the drug.  Amphetamine, dextroamphetamine, lisdexamfetamine are associated with same COMT genotype as well as possibility of serum level too high possibly requiring lower doses  Atomoxetine associated with CYP2D6 genotype which may cause increased side effects however also increased efficacy, also concern that serum level may be too high and lower doses may be required.      Past medical history, surgical history, social history and family history reviewed.  Patient Active Problem List   Diagnosis Date Noted  . Generalized anxiety disorder 07/07/2015  . PMDD (premenstrual dysphoric disorder) 04/09/2013  . 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   No current outpatient prescriptions on file prior to visit.   No current facility-administered medications on file prior to visit.    Allergies  Allergen Reactions  . Celexa [Citalopram Hydrobromide]     nausea  . Buspar [Buspirone]     nausea      Review of Systems:  Constitutional: No recent illness  Cardiac: No  chest pain,  Respiratory:  No  shortness of breath.   Musculoskeletal: No new myalgia/arthralgia  Skin: No  Rash  Psychiatric: No  concerns with  depression, +concerns with anxiety  Exam:  BP 106/69   Pulse 82   Ht 5\' 4"  (1.626 m)   Wt 157 lb (71.2 kg)   BMI 26.95 kg/m   Constitutional: VS see above. General Appearance: alert, well-developed, well-nourished, NAD  Neck: No masses, trachea midline.   Respiratory: Normal respiratory effort.   Musculoskeletal: Gait normal. Symmetric and independent movement of all extremities  Neurological: Normal balance/coordination. No tremor.  Skin: warm, dry, intact.   Psychiatric: Normal judgment/insight. Normal mood and affect. Oriented x3.      GAD 7 : Generalized Anxiety Score 09/06/2016  Nervous, Anxious, on Edge 3  Control/stop worrying 3  Worry too much - different things 3  Trouble relaxing 3  Restless 3  Easily annoyed or irritable 3  Afraid - awful might happen 3  Total GAD 7 Score 21    [facility] 09/06/2016 07/12/2016  Decreased Interest 1 2  Down, Depressed, Hopeless 1 2  PHQ - 2 Score 2 4  Altered sleeping 2 0  Tired, decreased energy 2 2  Change in appetite 2 2  Feeling bad or failure about yourself  2 2  Trouble concentrating 3 2  Moving slowly or fidgety/restless 3 2  Suicidal thoughts 0 0  PHQ-9 Score 16 14      ASSESSMENT/PLAN:   Patient brings her insurance formulary with her as well, Dexmethylphenidate is covered, daughter had been on methylphenidate in the past and this was not helpful so she would like to avoid this. After discussion of the results of the tests, keeping in mind that these are not hard and fast rules for prescribing, we decided on dexmethylphenidate/Focalin. Would consider  atomoxetine/Strattera or guanfacine/Intuniv as alternatives, these may require prior authorization if it comes to that.  For antianxiety/antidepressant, advised would probably consider Zoloft as this had favorable genetic testing based on her results and she had been on it when she was much younger and doesn't remember how well it works for her are  not.  Child just treating for ADHD at this point, patient to call me in 2 weeks to report how she is doing on new medications with follow-up in the office in 4 weeks  Genetic result testing sent to scan, patient also has copy of results  Attention and concentration deficit  Generalized anxiety disorder  PMDD (premenstrual dysphoric disorder)      Follow-up plan: Return in about 4 weeks (around 10/24/2016) for ADHD FOLLOW-UP .  Visit summary with medication list and pertinent instructions was printed for patient to review, alert [place] if any changes needed. All questions at time of visit were answered - patient instructed to contact office with any additional concerns. ER/RTC precautions were reviewed with the patient and understanding verbalized.   Note: Total time spent 25 minutes, greater than 50% of the visit was spent face-to-face counseling and coordinating care for the following: The primary encounter diagnosis was Attention and concentration deficit. Diagnoses of Generalized anxiety disorder and PMDD (premenstrual dysphoric disorder) were also pertinent to this visit.Marland Kitchen

## 2016-10-06 ENCOUNTER — Encounter: Payer: Self-pay | Admitting: Osteopathic Medicine

## 2016-10-23 ENCOUNTER — Ambulatory Visit (INDEPENDENT_AMBULATORY_CARE_PROVIDER_SITE_OTHER): Payer: 59 | Admitting: Osteopathic Medicine

## 2016-10-23 ENCOUNTER — Encounter: Payer: Self-pay | Admitting: Osteopathic Medicine

## 2016-10-23 VITALS — BP 110/66 | HR 71 | Wt 152.0 lb

## 2016-10-23 DIAGNOSIS — R41840 Attention and concentration deficit: Secondary | ICD-10-CM

## 2016-10-23 DIAGNOSIS — F411 Generalized anxiety disorder: Secondary | ICD-10-CM

## 2016-10-23 MED ORDER — DEXMETHYLPHENIDATE HCL 5 MG PO TABS: 5.0000 mg | ORAL_TABLET | Freq: Two times a day (BID) | ORAL | 0 refills | Status: DC

## 2016-10-23 MED ORDER — SERTRALINE HCL 25 MG PO TABS: 25.0000 mg | ORAL_TABLET | Freq: Every day | ORAL | 0 refills | Status: DC

## 2016-10-23 NOTE — Progress Notes (Signed)
HPI: Megan Roman is a 41 y.o. female  who presents to [facility] today, 10/23/16,  for chief complaint of:  Chief Complaint  Patient presents with  . ADD      Attention deficit with comorbid anxiety/depression. Recently evaluated by behavioral health and here to discuss any medications. Has had problems with ADHD medicines in the past, her daughter has history of similar. Based on results of elective pharmacologenomic testing, and discussion of risks versus benefits, at last visit we decided to initiate low dose of Ritalin twice a day.   She states that the morning dose has been helping significantly, she is able to focus on her work well. After he does not seem to be quite as helpful however. Has still been dealing with a lot of irritability, mood swings.      Past medical history, surgical history, social history and family history reviewed.  Patient Active Problem List   Diagnosis Date Noted  . Generalized anxiety disorder 07/07/2015  . PMDD (premenstrual dysphoric disorder) 04/09/2013  . 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   Current Outpatient Prescriptions on File Prior to Visit  Medication Sig Dispense Refill  . dexmethylphenidate (FOCALIN) 5 MG tablet Take 1 tablet (5 mg total) by mouth 2 (two) times daily. 60 tablet 0   No current facility-administered medications on file prior to visit.    Allergies  Allergen Reactions  . Celexa [Citalopram Hydrobromide]     nausea  . Buspar [Buspirone]     nausea      Review of Systems:  Constitutional: No recent illness  Cardiac: No  chest pain,  Respiratory:  No  shortness of breath.   Musculoskeletal: No new myalgia/arthralgia  Skin: No  Rash  Psychiatric: No  concerns with depression, +concerns with anxiety, concentration has improved  Exam:  BP 110/66   Pulse 71   Wt 152 lb (68.9 kg)    BMI 26.09 kg/m   Constitutional: VS see above. General Appearance: alert, well-developed, well-nourished, NAD  Neck: No masses, trachea midline.   Respiratory: Normal respiratory effort.   Musculoskeletal: Gait normal. Symmetric and independent movement of all extremities  Neurological: Normal balance/coordination. No tremor.  Skin: warm, dry, intact.   Psychiatric: Normal judgment/insight. Normal mood and affect. Oriented x3.     ASSESSMENT/PLAN:   Will continue Ritalin at current dosing, I am suspicious of concentration deficit and irritability being more due to untreated anxiety, will trial low-dose Zoloft in addition to the ADHD medications and see how this does.  Attention and concentration deficit - Plan: dexmethylphenidate (FOCALIN) 5 MG tablet  Generalized anxiety disorder - Plan: sertraline (ZOLOFT) 25 MG tablet      Follow-up plan: Return for recheck 4-6 weeks, sooner if needed .  Visit summary with medication list and pertinent instructions was printed for patient to review, alert us if any changes needed. All questions at time of visit were answered - patient instructed to contact office with any additional concerns. ER/RTC precautions were reviewed with the patient and understanding verbalized.   Note: Total time spent 15 minutes, greater than 50% of the visit was spent face-to-face counseling and coordinating care for the following: The primary encounter diagnosis was Attention and concentration deficit. A diagnosis of Generalized anxiety disorder was also pertinent to this visit..Marland Kitchen

## 2016-10-26 MED FILL — DEXMETHYLPHENIDATE 5 MG TAB: 5 | 30 days supply | Qty: 60 | Fill #0

## 2016-10-26 MED FILL — SERTRALINE HCL 25 MG TABLET: 25 | 52 days supply | Qty: 90 | Fill #0

## 2016-11-22 ENCOUNTER — Encounter: Payer: Self-pay | Admitting: Osteopathic Medicine

## 2016-11-22 ENCOUNTER — Ambulatory Visit (INDEPENDENT_AMBULATORY_CARE_PROVIDER_SITE_OTHER): Payer: 59 | Admitting: Osteopathic Medicine

## 2016-11-22 VITALS — BP 117/76 | HR 71 | Ht 64.0 in | Wt 149.0 lb

## 2016-11-22 DIAGNOSIS — B354 Tinea corporis: Secondary | ICD-10-CM | POA: Diagnosis not present

## 2016-11-22 MED ORDER — KETOCONAZOLE 2 % EX CREA: 1.0000 "application " | TOPICAL_CREAM | Freq: Every day | CUTANEOUS | 1 refills | Status: DC

## 2016-11-22 MED FILL — KETOCONAZOLE 2% CREAM: 2 | 15 days supply | Qty: 15 | Fill #0

## 2016-11-22 NOTE — Progress Notes (Signed)
HPI: Megan Roman is a 41 y.o. female  who [facility] to [facility] today, 11/22/16,  for chief complaint of:  Chief Complaint  Patient presents with  . Rash    Rash on shoulder . Context: has been there since going to pool on July 4th  . Location: R shoulder at bra line . Modifying factors: has been using steroid cream  . Assoc signs/symptoms: fatigue (on and off a chornic issue for her but worried it's related to rash)     Past medical history, surgical history, social history and family history reviewed.  Patient Active Problem List   Diagnosis Date Noted  . Generalized anxiety disorder 07/07/2015  . PMDD (premenstrual dysphoric disorder) 04/09/2013  . 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   Current Outpatient Prescriptions on File Prior to Visit  Medication Sig Dispense Refill  . dexmethylphenidate (FOCALIN) 5 MG tablet Take 1 tablet (5 mg total) by mouth 2 (two) times daily. 60 tablet 0  . sertraline (ZOLOFT) 25 MG tablet Take 1 tablet (25 mg total) by mouth daily. Can increase to 2 tablets after 2 weeks if desired - call/message Dr. Lyn HollingsheadAlexander if that's what you decide 90 tablet 0   No current facility-administered medications on file prior to visit.    Allergies  Allergen Reactions  . Celexa [Citalopram Hydrobromide]     nausea  . Buspar [Buspirone]     nausea      Review of Systems:  Constitutional: No recent illness, +fatigue  Musculoskeletal: No new myalgia/arthralgia  Skin: +Rash  Neurologic: No  weakness, No  Dizziness   Exam:  BP 117/76   Pulse 71   Ht 5\' 4"  (1.626 m)   Wt 149 lb (67.6 kg)   BMI 25.58 kg/m   Constitutional: VS see above. General Appearance: alert, well-developed, well-nourished, NAD  Skin: warm, dry, intact. Erythematous sandpapery rash as noted below, no scaling, no ulceration/drainage   Psychiatric:  Normal judgment/insight. Normal mood and affect. Oriented x3.       ASSESSMENT/PLAN:   Steroids may be calming down inflammation enough to confound the classic picture ofa true  Tinea corporis    Patient Instructions  Plan: Let's switch topical treatment to target fungal infection If no better or if worse, will get biopsy or consider dermatology referral     Follow-up plan: Return if symptoms worsen or fail to improve. Plan to keep currently scheduled follow-up for ADHD  Visit summary with medication list and pertinent instructions was printed for patient to review, alert us if any changes needed. All questions at time of visit were answered - patient instructed to contact office with any additional concerns. ER/RTC precautions were reviewed with the patient and understanding verbalized.

## 2016-11-22 NOTE — Patient Instructions (Signed)
Plan: Let's switch topical treatment to target fungal infection If no better or if worse, will get biopsy or consider dermatology referral

## 2016-12-05 ENCOUNTER — Encounter: Payer: Self-pay | Admitting: Osteopathic Medicine

## 2016-12-05 ENCOUNTER — Ambulatory Visit (INDEPENDENT_AMBULATORY_CARE_PROVIDER_SITE_OTHER): Payer: 59 | Admitting: Osteopathic Medicine

## 2016-12-05 VITALS — BP 106/68 | HR 86 | Resp 18 | Wt 153.0 lb

## 2016-12-05 DIAGNOSIS — F3281 Premenstrual dysphoric disorder: Secondary | ICD-10-CM | POA: Diagnosis not present

## 2016-12-05 DIAGNOSIS — Z1239 Encounter for other screening for malignant neoplasm of breast: Secondary | ICD-10-CM

## 2016-12-05 DIAGNOSIS — Z1231 Encounter for screening mammogram for malignant neoplasm of breast: Secondary | ICD-10-CM | POA: Diagnosis not present

## 2016-12-05 DIAGNOSIS — R41840 Attention and concentration deficit: Secondary | ICD-10-CM | POA: Diagnosis not present

## 2016-12-05 DIAGNOSIS — R5382 Chronic fatigue, unspecified: Secondary | ICD-10-CM | POA: Diagnosis not present

## 2016-12-05 MED ORDER — ETONOGESTREL-ETHINYL ESTRADIOL 0.12-0.015 MG/24HR VA RING: VAGINAL_RING | VAGINAL | 3 refills | Status: DC

## 2016-12-05 MED ORDER — LISDEXAMFETAMINE DIMESYLATE 30 MG PO CAPS: 30.0000 mg | ORAL_CAPSULE | Freq: Every day | ORAL | 0 refills | Status: DC

## 2016-12-05 MED FILL — VYVANSE 30 MG CAPSULE: 30 | 30 days supply | Qty: 30 | Fill #0

## 2016-12-05 MED FILL — NUVARING VAGINAL RING: 0.12-0.015 | 84 days supply | Qty: 3 | Fill #0

## 2016-12-05 NOTE — Progress Notes (Signed)
HPI: Megan Roman is a 41 y.o. female  who presents to [facility] Megan Roman today, 12/05/16,  for chief complaint of:  Chief Complaint  Patient presents with  . Follow-up     Attention deficit with comorbid anxiety/depression.  Recently evaluated by behavioral health and here to followup on medications.   Has had problems with ADHD medicines in the past, her daughter has history of similar. Based on results of elective pharmacologenomic testing, and discussion of risks versus benefits, at last visit we decided to initiate low dose of Focalin twice a day but she has found that this is not helping anymore.   WOuld like to try Vyvanse, which her daughter is taking and doing well on.   Depression/Anxiety  Stopped the Zoloft   She has a lot of concerns about whether "hormone levels" can be contributing to her issues. She has discussed this with her OB/GYN who did not advise testing. Patient reports occasional flushing but no major problems with night sweats or other menopausal symptoms. She has had an ablation due to excessive bleeding.       Past medical history, surgical history, social history and family history reviewed.  Patient Active Problem List   Diagnosis Date Noted  . Generalized anxiety disorder 07/07/2015  . PMDD (premenstrual dysphoric disorder) 04/09/2013  . 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   Current Outpatient Prescriptions on File Prior to Visit  Medication Sig Dispense Refill  . dexmethylphenidate (FOCALIN) 5 MG tablet Take 1 tablet (5 mg total) by mouth 2 (two) times daily. 60 tablet 0  . ketoconazole (NIZORAL) 2 % cream Apply 1 application topically daily. Continue 2-3 days after resolution of rash 15 g 1  . sertraline (ZOLOFT) 25 MG tablet Take 1 tablet (25 mg total) by mouth daily. Can increase to 2 tablets after 2 weeks if desired -  call/message Dr. Lyn Hollingshead if that's what you decide 90 tablet 0   No current facility-administered medications on file prior to visit.    Allergies  Allergen Reactions  . Celexa [Citalopram Hydrobromide]     nausea  . Buspar [Buspirone]     nausea      Review of Systems:  Constitutional: No recent illness  Cardiac: No  chest pain,  Respiratory:  No  shortness of breath.   Musculoskeletal: No new myalgia/arthralgia  Skin: No  Rash  Psychiatric: No  concerns with depression, +concerns with anxiety, concentration has improved iniially but then seemed to go backwards.   Exam:  BP 106/68   Pulse 86   Resp 18   Wt 153 lb (69.4 kg)   BMI 26.26 kg/m   Constitutional: VS see above. General Appearance: alert, well-developed, well-nourished, NAD  Neck: No masses, trachea midline.   Respiratory: Normal respiratory effort.   Musculoskeletal: Gait normal. Symmetric and independent movement of all extremities  Neurological: Normal balance/coordination. No tremor.  Skin: warm, dry, intact.   Psychiatric: Normal judgment/insight. Normal mood and affect. Oriented x3.     ASSESSMENT/PLAN:   Patient stopped Zoloft, didn't think it was helping. Remove this for medication list.  We'll transition from Focalin to Vyvanse since patient's daughter as well as this medication.  Would consider testing hormone levels but I don't think she is really showing any signs or symptoms of premature menopause, there is no reason to think at this point that she's not cycling normally, symptoms may very well be due to PMDD.  Will trial treatment of this with NuvaRing, patient did well with this in the past though second round with this cause some abnormal bleeding however with ablation this shouldn't be an issue for her now.  If doing well on Vyvanse, can call for refills until follow-up in 3 months, advised office visit needed every 3 months for ADHD medications.  Attention and concentration  deficit - Plan: lisdexamfetamine (VYVANSE) 30 MG capsule  Screening for breast cancer - Plan: MM DIGITAL SCREENING BILATERAL  Chronic fatigue  PMDD (premenstrual dysphoric disorder) - Plan: etonogestrel-ethinyl estradiol (NUVARING) 0.12-0.015 MG/24HR vaginal ring      Follow-up plan: Return in about 3 months (around 03/07/2017) for recheck ADHD .  Visit summary with medication list and pertinent instructions was printed for patient to review, alert us if any changes needed. All questions at time of visit were answered - patient instructed to contact office with any additional concerns. ER/RTC precautions were reviewed with the patient and understanding verbalized.

## 2016-12-22 ENCOUNTER — Ambulatory Visit: Payer: Self-pay

## 2016-12-26 ENCOUNTER — Other Ambulatory Visit: Payer: Self-pay | Admitting: Osteopathic Medicine

## 2016-12-26 DIAGNOSIS — R41840 Attention and concentration deficit: Secondary | ICD-10-CM

## 2017-01-03 ENCOUNTER — Encounter: Payer: Self-pay | Admitting: Osteopathic Medicine

## 2017-01-03 ENCOUNTER — Other Ambulatory Visit: Payer: Self-pay

## 2017-01-03 ENCOUNTER — Other Ambulatory Visit: Payer: Self-pay | Admitting: Osteopathic Medicine

## 2017-01-03 DIAGNOSIS — R41840 Attention and concentration deficit: Secondary | ICD-10-CM

## 2017-01-03 MED ORDER — LISDEXAMFETAMINE DIMESYLATE 30 MG PO CAPS: 30.0000 mg | ORAL_CAPSULE | Freq: Every day | ORAL | 0 refills | Status: DC

## 2017-01-03 NOTE — Progress Notes (Signed)
Rx printed and left up front. 

## 2017-01-08 MED FILL — VYVANSE 30 MG CAPSULE: 30 | 30 days supply | Qty: 30 | Fill #0

## 2017-02-05 ENCOUNTER — Telehealth: Payer: Self-pay

## 2017-02-05 ENCOUNTER — Other Ambulatory Visit: Payer: Self-pay

## 2017-02-05 DIAGNOSIS — R41840 Attention and concentration deficit: Secondary | ICD-10-CM

## 2017-02-05 MED ORDER — LISDEXAMFETAMINE DIMESYLATE 30 MG PO CAPS: 30.0000 mg | ORAL_CAPSULE | Freq: Every day | ORAL | 0 refills | Status: DC

## 2017-02-05 NOTE — Telephone Encounter (Signed)
Pt called for her Vyvanse prescription.  I placed it in your box for a signature.

## 2017-02-05 NOTE — Telephone Encounter (Signed)
Completed nad signed.

## 2017-02-07 MED FILL — VYVANSE 30 MG CAPSULE: 30 | 30 days supply | Qty: 30 | Fill #0

## 2017-02-14 ENCOUNTER — Ambulatory Visit (INDEPENDENT_AMBULATORY_CARE_PROVIDER_SITE_OTHER): Payer: 59

## 2017-02-14 DIAGNOSIS — Z1231 Encounter for screening mammogram for malignant neoplasm of breast: Secondary | ICD-10-CM | POA: Diagnosis not present

## 2017-02-15 ENCOUNTER — Other Ambulatory Visit: Payer: Self-pay | Admitting: Osteopathic Medicine

## 2017-02-15 DIAGNOSIS — F3281 Premenstrual dysphoric disorder: Secondary | ICD-10-CM

## 2017-03-06 ENCOUNTER — Ambulatory Visit (INDEPENDENT_AMBULATORY_CARE_PROVIDER_SITE_OTHER): Payer: 59 | Admitting: Osteopathic Medicine

## 2017-03-06 ENCOUNTER — Encounter: Payer: Self-pay | Admitting: Osteopathic Medicine

## 2017-03-06 VITALS — BP 104/72 | HR 79 | Temp 98.4°F | Resp 16 | Wt 141.6 lb

## 2017-03-06 DIAGNOSIS — F3281 Premenstrual dysphoric disorder: Secondary | ICD-10-CM | POA: Diagnosis not present

## 2017-03-06 DIAGNOSIS — R41840 Attention and concentration deficit: Secondary | ICD-10-CM | POA: Diagnosis not present

## 2017-03-06 MED ORDER — LISDEXAMFETAMINE DIMESYLATE 40 MG PO CAPS: 40.0000 mg | ORAL_CAPSULE | Freq: Every day | ORAL | 0 refills | Status: DC

## 2017-03-06 NOTE — Progress Notes (Signed)
HPI: Megan Roman is a 41 y.o. female  who presents to [facility] [place] today, 03/06/17,  for chief complaint of:  No chief complaint on file.    Attention deficit with comorbid anxiety/depression.  evaluated by behavioral health to confirm diagnosis  Has had problems with ADHD medicines in the past, her daughter has history of similar.   Based on results of elective pharmacologenomic testing, and discussion of risks versus benefits, at previous visit we decided to initiate low dose of Focalin twice a day but she had found that this is not helping anymore.   We switched to Vyvanse, which her daughter was taking and doing well on. She has been on 30 mg dose for about 3 months now. Would like to talk about going up on dose or changing meds altogether.   She stopped the birth control (NuvaRing, which was helping with fatigue and energy but seemed to be causing nausea and headaches). Fatigue is still biggest problem at this point.   Depression/Anxiety  Stopped the Zoloft d/t problems with sleepiness, not interested in other anti-anxiety measures at this time    She has a lot of concerns about whether "hormone levels" can be contributing to her issues. She has discussed this with her OB/GYN who did not advise testing. Patient reports occasional flushing but no major problems with night sweats or other menopausal symptoms. She has had an ablation due to excessive bleeding.       Past medical history, surgical history, social history and family history reviewed.  Patient Active Problem List   Diagnosis Date Noted  . Generalized anxiety disorder 07/07/2015  . PMDD (premenstrual dysphoric disorder) 04/09/2013  . 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   Current Outpatient Medications on File Prior to Visit  Medication Sig Dispense Refill  . ketoconazole (NIZORAL) 2 %  cream Apply 1 application topically daily. Continue 2-3 days after resolution of rash 15 g 1  . lisdexamfetamine (VYVANSE) 30 MG capsule Take 1 capsule (30 mg total) by mouth daily. OK to fill 01/05/17 30 capsule 0   No current facility-administered medications on file prior to visit.    Allergies  Allergen Reactions  . Celexa [Citalopram Hydrobromide]     nausea  . Buspar [Buspirone]     nausea      Review of Systems:  Constitutional: No recent illness  Cardiac: No  chest pain,  Respiratory:  No  shortness of breath.   Musculoskeletal: No new myalgia/arthralgia  Skin: No  Rash  Psychiatric: No  concerns with depression, +concerns with anxiety, concentration has improved iniially but then seemed to go backwards.   Exam:  BP 104/72 (BP Location: Right Arm, Patient Position: Sitting, Cuff Size: Small)   Pulse 79   Temp 98.4 F (36.9 C) (Oral)   Resp 16   Wt 141 lb 9.6 oz (64.2 kg)   BMI 24.31 kg/m   Constitutional: VS see above. General Appearance: alert, well-developed, well-nourished, NAD  Neck: No masses, trachea midline.   Respiratory: Normal respiratory effort.   Musculoskeletal: Gait normal. Symmetric and independent movement of all extremities  Neurological: Normal balance/coordination. No tremor.  Skin: warm, dry, intact.   Psychiatric: Normal judgment/insight. Normal mood and affect. Oriented x3.     ASSESSMENT/PLAN:   If doing well on Vyvanse increased dose, can call for refills until follow-up in 3 months, advised office visit needed every 3 months for ADHD medications.  PMDD (premenstrual dysphoric disorder)  Attention and concentration deficit - Plan: lisdexamfetamine (VYVANSE) 40 MG capsule   Patient Instructions  Plan:  Increase Vyvanse to 40 mg - caution with increasing stimulants which can exacerbate anxiety   Let me know how you're doing on this medicine after a month  If doing well, can refill and see me in 3 months from now  If  not, we can increase to 50 mg if needed   Follow with OBGYN re: hormone options     Follow-up plan: Return in about 3 months (around 06/06/2017) for refill ADHD medications, sooner if needed.  Visit summary with medication list and pertinent instructions was printed for patient to review, alert us if any changes needed. All questions at time of visit were answered - patient instructed to contact office with any additional concerns. ER/RTC precautions were reviewed with the patient and understanding verbalized.

## 2017-03-06 NOTE — Patient Instructions (Addendum)
Plan:  Increase Vyvanse to 40 mg - caution with increasing stimulants which can exacerbate anxiety   Let me know how you're doing on this medicine after a month  If doing well, can refill and see me in 3 months from now  If not, we can increase to 50 mg if needed   Follow with OBGYN re: hormone options

## 2017-03-07 MED FILL — VYVANSE 40 MG CAPSULE: 40 | 30 days supply | Qty: 30 | Fill #0

## 2017-03-20 DIAGNOSIS — R87610 Atypical squamous cells of undetermined significance on cytologic smear of cervix (ASC-US): Secondary | ICD-10-CM | POA: Diagnosis not present

## 2017-03-20 DIAGNOSIS — Z6826 Body mass index (BMI) 26.0-26.9, adult: Secondary | ICD-10-CM | POA: Diagnosis not present

## 2017-03-20 DIAGNOSIS — Z01419 Encounter for gynecological examination (general) (routine) without abnormal findings: Secondary | ICD-10-CM | POA: Diagnosis not present

## 2017-04-02 ENCOUNTER — Encounter: Payer: Self-pay | Admitting: Osteopathic Medicine

## 2017-04-02 DIAGNOSIS — R41840 Attention and concentration deficit: Secondary | ICD-10-CM

## 2017-04-03 MED ORDER — LISDEXAMFETAMINE DIMESYLATE 30 MG PO CAPS: 30.0000 mg | ORAL_CAPSULE | ORAL | 0 refills | Status: DC

## 2017-04-03 MED ORDER — AMPHETAMINE-DEXTROAMPHETAMINE 10 MG PO TABS: 10.0000 mg | ORAL_TABLET | Freq: Every day | ORAL | 0 refills | Status: DC

## 2017-04-04 ENCOUNTER — Telehealth: Payer: Self-pay | Admitting: Osteopathic Medicine

## 2017-04-04 DIAGNOSIS — F411 Generalized anxiety disorder: Secondary | ICD-10-CM

## 2017-04-04 DIAGNOSIS — R41840 Attention and concentration deficit: Secondary | ICD-10-CM

## 2017-04-04 MED FILL — DEXTROAMP-AMP 10 MG TAB: 10 | 30 days supply | Qty: 30 | Fill #0

## 2017-04-04 MED FILL — VYVANSE 30 MG CAPSULE: 30 | 30 days supply | Qty: 30 | Fill #0

## 2017-04-04 NOTE — Telephone Encounter (Signed)
See my chart email, referral to behavioral health

## 2017-05-07 ENCOUNTER — Other Ambulatory Visit: Payer: Self-pay

## 2017-05-07 ENCOUNTER — Telehealth: Payer: Self-pay

## 2017-05-07 DIAGNOSIS — R41840 Attention and concentration deficit: Secondary | ICD-10-CM

## 2017-05-07 NOTE — Telephone Encounter (Signed)
Pt requesting refills for adderall 10 mg & vyvanse 30 mg to be [facility]. Per pcp notes, medications appropriate. Thanks.

## 2017-05-08 MED ORDER — LISDEXAMFETAMINE DIMESYLATE 30 MG PO CAPS: 30.0000 mg | ORAL_CAPSULE | ORAL | 0 refills | Status: DC

## 2017-05-08 MED ORDER — AMPHETAMINE-DEXTROAMPHETAMINE 10 MG PO TABS: 10.0000 mg | ORAL_TABLET | Freq: Every day | ORAL | 0 refills | Status: DC

## 2017-05-08 MED FILL — VYVANSE 30 MG CAPSULE: 30 | 30 days supply | Qty: 30 | Fill #0

## 2017-05-08 MED FILL — AMPHETAMINE-DEXTROAMPHETAMI: 10 | 30 days supply | Qty: 30 | Fill #0

## 2017-05-08 NOTE — Telephone Encounter (Signed)
Refills sent

## 2017-05-08 NOTE — Telephone Encounter (Signed)
Pt has been updated and informed medications refills sent to pharmacy on file.

## 2017-06-04 ENCOUNTER — Telehealth: Payer: 59 | Admitting: Family

## 2017-06-04 DIAGNOSIS — J111 Influenza due to unidentified influenza virus with other respiratory manifestations: Secondary | ICD-10-CM

## 2017-06-04 MED ORDER — OSELTAMIVIR PHOSPHATE 75 MG PO CAPS: 75.0000 mg | ORAL_CAPSULE | Freq: Two times a day (BID) | ORAL | 0 refills | Status: DC

## 2017-06-04 NOTE — Progress Notes (Signed)
Thank you for the details you included in the comment boxes. Those details are very helpful in determining the best course of treatment for you and help us to provide the best care.  E visit for Flu like symptoms   We are sorry that you are not feeling well.  Here is how we plan to help! Based on what you have shared with me it looks like you may have possible exposure to a virus that causes influenza.  Influenza or "the flu" is   an infection caused by a respiratory virus. The flu virus is highly contagious and persons who did not receive their yearly flu vaccination may "catch" the flu from close contact.  We have anti-viral medications to treat the viruses that cause this infection. They are not a "cure" and only shorten the course of the infection. These prescriptions are most effective when they are given within the first 2 days of "flu" symptoms. Antiviral medication are indicated if you have a high risk of complications from the flu. You should  also consider an antiviral medication if you are in close contact with someone who is at risk. These medications can help patients avoid complications from the flu  but have side effects that you should know. Possible side effects from Tamiflu or oseltamivir include nausea, vomiting, diarrhea, dizziness, headaches, eye redness, sleep problems or other respiratory symptoms. You should not take Tamiflu if you have an allergy to oseltamivir or any to the ingredients in Tamiflu.  Based upon your symptoms and potential risk factors I have prescribed Oseltamivir (Tamiflu).  It has been sent to your designated pharmacy.  You will take one 75 mg capsule orally twice a day for the next 5 days.  ANYONE WHO HAS FLU SYMPTOMS SHOULD: . Stay home. The flu is highly contagious and going out or to work exposes others! . Be sure to drink plenty of fluids. Water is fine as well as fruit juices, sodas and electrolyte beverages. You may want to stay away from caffeine or  alcohol. If you are nauseated, try taking small sips of liquids. How do you know if you are getting enough fluid? Your urine should be a pale yellow or almost colorless. . Get rest. . Taking a steamy shower or using a humidifier may help nasal congestion and ease sore throat pain. Using a saline nasal spray works much the same way. . Cough drops, hard candies and sore throat lozenges may ease your cough. . Line up a caregiver. Have someone check on you regularly.   GET HELP RIGHT AWAY IF: . You cannot keep down liquids or your medications. . You become short of breath . Your fell like you are going to pass out or loose consciousness. . Your symptoms persist after you have completed your treatment plan MAKE SURE YOU   Understand these instructions.  Will watch your condition.  Will get help right away if you are not doing well or get worse.  Your e-visit answers were reviewed by a board certified advanced clinical practitioner to complete your personal care plan.  Depending on the condition, your plan could have included both over the counter or prescription medications.  If there is a problem please reply  once you have received a response from your provider.  Your safety is important to us.  If you have drug allergies check your prescription carefully.    You can use MyChart to ask questions about today's visit, request a non-urgent call back, or ask   for a work or school excuse for 24 hours related to this e-Visit. If it has been greater than 24 hours you will need to follow up with your provider, or enter a new e-Visit to address those concerns.  You will get an e-mail in the next two days asking about your experience.  I hope that your e-visit has been valuable and will speed your recovery. Thank you for using e-visits.   

## 2017-06-08 ENCOUNTER — Other Ambulatory Visit: Payer: Self-pay | Admitting: Osteopathic Medicine

## 2017-06-08 DIAGNOSIS — R41840 Attention and concentration deficit: Secondary | ICD-10-CM

## 2017-06-08 MED ORDER — LISDEXAMFETAMINE DIMESYLATE 30 MG PO CAPS: 30.0000 mg | ORAL_CAPSULE | ORAL | 0 refills | Status: DC

## 2017-06-08 MED ORDER — AMPHETAMINE-DEXTROAMPHETAMINE 10 MG PO TABS: 10.0000 mg | ORAL_TABLET | Freq: Every day | ORAL | 0 refills | Status: DC

## 2017-06-08 MED FILL — DEXTROAMP-AMP 10 MG TAB: 10 | 30 days supply | Qty: 30 | Fill #0

## 2017-06-08 MED FILL — VYVANSE 30 MG CAPSULE: 30 | 30 days supply | Qty: 30 | Fill #0

## 2017-06-08 NOTE — Telephone Encounter (Signed)
Pt advised.

## 2017-06-08 NOTE — Telephone Encounter (Signed)
Sent!

## 2017-06-08 NOTE — Telephone Encounter (Signed)
Pharmacy requesting refills for medications. Thanks.

## 2017-07-10 ENCOUNTER — Telehealth: Payer: Self-pay

## 2017-07-10 MED ORDER — AMPHETAMINE-DEXTROAMPHETAMINE 10 MG PO TABS: 10.0000 mg | ORAL_TABLET | Freq: Every day | ORAL | 0 refills | Status: DC

## 2017-07-10 MED FILL — DEXTROAMP-AMP 10 MG TAB: 10 | 30 days supply | Qty: 30 | Fill #0

## 2017-07-10 NOTE — Telephone Encounter (Signed)
Left pt a brief vm msg regarding med refill. Call back information provided.

## 2017-07-10 NOTE — Telephone Encounter (Signed)
Pt called requesting a med refill for adderall 10 mg. She would like medication to be sent to [facility]. Thanks.

## 2017-07-10 NOTE — Telephone Encounter (Signed)
Sent!

## 2017-07-11 ENCOUNTER — Other Ambulatory Visit: Payer: Self-pay | Admitting: Osteopathic Medicine

## 2017-07-11 DIAGNOSIS — R41840 Attention and concentration deficit: Secondary | ICD-10-CM

## 2017-07-12 MED FILL — VYVANSE 30 MG CAPSULE: 30 | 30 days supply | Qty: 30 | Fill #0

## 2017-07-12 NOTE — Telephone Encounter (Signed)
Pharmacy requesting med refill

## 2017-07-12 NOTE — Telephone Encounter (Signed)
Sent!

## 2017-08-15 ENCOUNTER — Other Ambulatory Visit: Payer: Self-pay | Admitting: Osteopathic Medicine

## 2017-08-15 DIAGNOSIS — R41840 Attention and concentration deficit: Secondary | ICD-10-CM

## 2017-08-15 MED FILL — VYVANSE 30 MG CAPSULE: 30 | 30 days supply | Qty: 30 | Fill #0

## 2017-08-15 MED FILL — DEXTROAMP-AMP 10 MG TAB: 10 | 30 days supply | Qty: 30 | Fill #0

## 2017-08-15 NOTE — Telephone Encounter (Signed)
Left vm msg regarding med refills sent to [facility]. Call back information provided.

## 2017-08-15 NOTE — Telephone Encounter (Signed)
[facility] requesting med refills. Pls advise if appropriate. Thanks.

## 2017-08-31 ENCOUNTER — Ambulatory Visit (INDEPENDENT_AMBULATORY_CARE_PROVIDER_SITE_OTHER): Payer: 59 | Admitting: Osteopathic Medicine

## 2017-08-31 ENCOUNTER — Ambulatory Visit (INDEPENDENT_AMBULATORY_CARE_PROVIDER_SITE_OTHER): Payer: 59

## 2017-08-31 ENCOUNTER — Encounter: Payer: Self-pay | Admitting: Osteopathic Medicine

## 2017-08-31 VITALS — BP 113/55 | HR 79 | Temp 98.2°F | Wt 149.7 lb

## 2017-08-31 DIAGNOSIS — Z0000 Encounter for general adult medical examination without abnormal findings: Secondary | ICD-10-CM | POA: Diagnosis not present

## 2017-08-31 DIAGNOSIS — R41840 Attention and concentration deficit: Secondary | ICD-10-CM

## 2017-08-31 DIAGNOSIS — M79645 Pain in left finger(s): Secondary | ICD-10-CM | POA: Diagnosis not present

## 2017-08-31 DIAGNOSIS — F3281 Premenstrual dysphoric disorder: Secondary | ICD-10-CM

## 2017-08-31 DIAGNOSIS — F411 Generalized anxiety disorder: Secondary | ICD-10-CM

## 2017-08-31 LAB — COMPLETE METABOLIC PANEL WITH GFR
AG Ratio: 1.7 (calc) (ref 1.0–2.5)
ALKALINE PHOSPHATASE (APISO): 46 U/L (ref 33–115)
ALT: 21 U/L (ref 6–29)
AST: 17 U/L (ref 10–30)
Albumin: 4.2 g/dL (ref 3.6–5.1)
BILIRUBIN TOTAL: 0.6 mg/dL (ref 0.2–1.2)
BUN: 14 mg/dL (ref 7–25)
CHLORIDE: 104 mmol/L (ref 98–110)
CO2: 32 mmol/L (ref 20–32)
Calcium: 9.8 mg/dL (ref 8.6–10.2)
Creat: 0.74 mg/dL (ref 0.50–1.10)
GFR, Est African American: 117 mL/min/{1.73_m2} (ref >=60)
GFR, Est Non African American: 101 mL/min/{1.73_m2} (ref >=60)
GLUCOSE: 85 mg/dL (ref 65–99)
Globulin: 2.5 g/dL (calc) (ref 1.9–3.7)
Potassium: 4.3 mmol/L (ref 3.5–5.3)
Sodium: 140 mmol/L (ref 135–146)
TOTAL PROTEIN: 6.7 g/dL (ref 6.1–8.1)

## 2017-08-31 LAB — TSH: TSH: 1.46 m[IU]/L

## 2017-08-31 LAB — CBC
HCT: 42.6 % (ref 35.0–45.0)
Hemoglobin: 14.4 g/dL (ref 11.7–15.5)
MCH: 32.3 pg (ref 27.0–33.0)
MCHC: 33.8 g/dL (ref 32.0–36.0)
MCV: 95.5 fL (ref 80.0–100.0)
MPV: 11.9 fL (ref 7.5–12.5)
PLATELETS: 227 10*3/uL (ref 140–400)
RBC: 4.46 10*6/uL (ref 3.80–5.10)
RDW: 12.2 % (ref 11.0–15.0)
WBC: 7.3 10*3/uL (ref 3.8–10.8)

## 2017-08-31 LAB — LIPID PANEL
CHOL/HDL RATIO: 2.6 (calc) (ref <5.0)
Cholesterol: 174 mg/dL (ref <200)
HDL: 66 mg/dL (ref >50)
LDL CHOLESTEROL (CALC): 93 mg/dL
NON-HDL CHOLESTEROL (CALC): 108 mg/dL (ref <130)
TRIGLYCERIDES: 63 mg/dL (ref <150)

## 2017-08-31 MED ORDER — LISDEXAMFETAMINE DIMESYLATE 30 MG PO CAPS: 30.0000 mg | ORAL_CAPSULE | Freq: Every morning | ORAL | 0 refills | Status: DC

## 2017-08-31 MED ORDER — AMPHETAMINE-DEXTROAMPHETAMINE 10 MG PO TABS: 10.0000 mg | ORAL_TABLET | Freq: Every day | ORAL | 0 refills | Status: DC

## 2017-08-31 NOTE — Patient Instructions (Addendum)
If hand is not better or if it gets worse, I would recommend follow-up with one of our sports medicine specialists (Dr Denyse Amass or Dr. Cherylann Parr Dr. Karie Schwalbe) for further evaluation in 1-2 weeks. Just call our office and ask for an appointment for sports medicine!      Please note: Preventive care issues were addressed today as part of your annual wellness physical, and this care should be covered under your insurance. However there were other medical issues which were also addressed today - namely attention deficit issues and hand pain which were evaluated and treated. Your insurance may bill you separately for a "problem-based visit" for such care. Any questions or concerns about charges which may appear on your billing statement should be directed to your insurance company or to [facility] billing department, and they will contact our office if there are further concerns.
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
## 2017-09-01 LAB — VITAMIN D 25 HYDROXY (VIT D DEFICIENCY, FRACTURES): VIT D 25 HYDROXY: 48 ng/mL (ref 30–100)

## 2017-09-19 MED FILL — DEXTROAMP-AMP 10 MG TAB: 10 | 30 days supply | Qty: 30 | Fill #0

## 2017-09-19 MED FILL — VYVANSE 30 MG CAPSULE: 30 | 30 days supply | Qty: 30 | Fill #0

## 2017-10-16 ENCOUNTER — Other Ambulatory Visit: Payer: Self-pay | Admitting: Osteopathic Medicine

## 2017-10-16 DIAGNOSIS — R41840 Attention and concentration deficit: Secondary | ICD-10-CM

## 2017-10-16 NOTE — Telephone Encounter (Signed)
[facility] for vyvanse.

## 2017-10-17 MED FILL — VYVANSE 30 MG CAPSULE: 30 | 30 days supply | Qty: 30 | Fill #0

## 2017-10-17 NOTE — Telephone Encounter (Signed)
Sent!

## 2017-10-17 NOTE — Telephone Encounter (Signed)
Pt has been updated.  

## 2017-11-21 ENCOUNTER — Other Ambulatory Visit: Payer: Self-pay | Admitting: Family Medicine

## 2017-11-21 ENCOUNTER — Encounter: Payer: Self-pay | Admitting: Osteopathic Medicine

## 2017-11-21 ENCOUNTER — Other Ambulatory Visit: Payer: Self-pay | Admitting: Osteopathic Medicine

## 2017-11-21 DIAGNOSIS — R41840 Attention and concentration deficit: Secondary | ICD-10-CM

## 2017-11-21 MED ORDER — LISDEXAMFETAMINE DIMESYLATE 30 MG PO CAPS: 30.0000 mg | ORAL_CAPSULE | Freq: Every morning | ORAL | 0 refills | Status: DC

## 2017-11-21 MED ORDER — AMPHETAMINE-DEXTROAMPHETAMINE 10 MG PO TABS: 10.0000 mg | ORAL_TABLET | Freq: Every day | ORAL | 0 refills | Status: DC

## 2017-11-22 ENCOUNTER — Telehealth: Payer: Self-pay | Admitting: Family Medicine

## 2017-11-22 DIAGNOSIS — R41840 Attention and concentration deficit: Secondary | ICD-10-CM

## 2017-11-22 NOTE — Telephone Encounter (Signed)
Hello Marchelle FolksAmanda,  Both Dr. Glade LloydMatheny and I have been covering for Dr. Lyn HollingsheadAlexander while she is away.  I am contacting you because I got a refill request for the Adderall and Vyvanse.  It looks like Dr. Glade LloydMatheny refilled these medications yesterday and you should be able to get them.  However to answer the larger question [place] [facility] in [place] definitely does prescribe ADHD medications.  I have had very good results sending patients there and I feel that they treat people well.  They generally help get people on the right path and do a great job.   I have placed a referral but I think you can also call yourself.   [phone] [address], [place], [place] [address]  You will likely fill out some forms and return to forms before they schedule an appointment.   Let me know if you have any problems.   Clementeen GrahamEvan Hyun Reali, MD

## 2017-11-30 MED FILL — AMPHETAMINE-DEXTROAMPHETAMI: 10 | 30 days supply | Qty: 30 | Fill #0

## 2017-11-30 MED FILL — VYVANSE 30 MG CAPSULE: 30 | 30 days supply | Qty: 30 | Fill #0

## 2017-11-30 MED FILL — LO LOESTRIN FE 1-10 TABLET: 1 MG-10 MCG | 84 days supply | Qty: 84 | Fill #0

## 2017-12-12 ENCOUNTER — Encounter: Payer: Self-pay | Admitting: Osteopathic Medicine

## 2017-12-17 ENCOUNTER — Ambulatory Visit: Payer: Self-pay | Admitting: Family Medicine

## 2017-12-26 DIAGNOSIS — R41840 Attention and concentration deficit: Secondary | ICD-10-CM | POA: Diagnosis not present

## 2017-12-26 DIAGNOSIS — F338 Other recurrent depressive disorders: Secondary | ICD-10-CM | POA: Diagnosis not present

## 2017-12-26 DIAGNOSIS — F902 Attention-deficit hyperactivity disorder, combined type: Secondary | ICD-10-CM | POA: Diagnosis not present

## 2017-12-26 DIAGNOSIS — Z79899 Other long term (current) drug therapy: Secondary | ICD-10-CM | POA: Diagnosis not present

## 2017-12-26 DIAGNOSIS — F419 Anxiety disorder, unspecified: Secondary | ICD-10-CM | POA: Diagnosis not present

## 2018-01-01 MED FILL — VYVANSE 30 MG CAPSULE: 30 | 30 days supply | Qty: 30 | Fill #0

## 2018-01-01 MED FILL — DESVENLAFAXINE SUC ER 50 MG: 50 | 30 days supply | Qty: 30 | Fill #0

## 2018-01-24 MED FILL — VYVANSE 60 MG CHEW: 60 | 30 days supply | Qty: 30 | Fill #0

## 2018-02-12 DIAGNOSIS — Z135 Encounter for screening for eye and ear disorders: Secondary | ICD-10-CM | POA: Diagnosis not present

## 2018-02-12 DIAGNOSIS — H5203 Hypermetropia, bilateral: Secondary | ICD-10-CM | POA: Diagnosis not present

## 2018-02-12 DIAGNOSIS — H40033 Anatomical narrow angle, bilateral: Secondary | ICD-10-CM | POA: Diagnosis not present

## 2018-02-12 DIAGNOSIS — H524 Presbyopia: Secondary | ICD-10-CM | POA: Diagnosis not present

## 2018-02-12 DIAGNOSIS — H52223 Regular astigmatism, bilateral: Secondary | ICD-10-CM | POA: Diagnosis not present

## 2018-02-14 DIAGNOSIS — F419 Anxiety disorder, unspecified: Secondary | ICD-10-CM | POA: Diagnosis not present

## 2018-02-14 DIAGNOSIS — F902 Attention-deficit hyperactivity disorder, combined type: Secondary | ICD-10-CM | POA: Diagnosis not present

## 2018-02-14 DIAGNOSIS — Z79899 Other long term (current) drug therapy: Secondary | ICD-10-CM | POA: Diagnosis not present

## 2018-02-14 MED FILL — buPROPion HCL ER (XL) 150 M: 150 | 30 days supply | Qty: 30 | Fill #0

## 2018-02-21 MED FILL — VYVANSE 60 MG CHEW: 60 | 30 days supply | Qty: 30 | Fill #0

## 2018-03-12 ENCOUNTER — Encounter: Payer: Self-pay | Admitting: Certified Nurse Midwife

## 2018-03-12 ENCOUNTER — Ambulatory Visit (INDEPENDENT_AMBULATORY_CARE_PROVIDER_SITE_OTHER): Payer: 59 | Admitting: Certified Nurse Midwife

## 2018-03-12 VITALS — BP 99/68 | HR 78 | Ht 64.0 in | Wt 138.0 lb

## 2018-03-12 DIAGNOSIS — N760 Acute vaginitis: Secondary | ICD-10-CM | POA: Diagnosis not present

## 2018-03-12 DIAGNOSIS — B9689 Other specified bacterial agents as the cause of diseases classified elsewhere: Secondary | ICD-10-CM | POA: Diagnosis not present

## 2018-03-12 DIAGNOSIS — Z124 Encounter for screening for malignant neoplasm of cervix: Secondary | ICD-10-CM

## 2018-03-12 DIAGNOSIS — N898 Other specified noninflammatory disorders of vagina: Secondary | ICD-10-CM

## 2018-03-12 DIAGNOSIS — Z01419 Encounter for gynecological examination (general) (routine) without abnormal findings: Secondary | ICD-10-CM

## 2018-03-12 DIAGNOSIS — Z1151 Encounter for screening for human papillomavirus (HPV): Secondary | ICD-10-CM

## 2018-03-12 MED ORDER — TINIDAZOLE 500 MG PO TABS: 2.0000 g | ORAL_TABLET | Freq: Every day | ORAL | 0 refills | Status: DC

## 2018-03-12 MED ORDER — BORIC ACID CRYS: 600.0000 mg | CRYSTALS | Freq: Every day | 3 refills | Status: DC

## 2018-03-12 MED FILL — TINIDAZOLE 500 MG TABS: 500 | 2 days supply | Qty: 8 | Fill #0

## 2018-03-12 NOTE — Progress Notes (Signed)
Last pap- sept 2018- normal Last mammogram- 02/14/17- normal

## 2018-03-12 NOTE — Progress Notes (Signed)
GYNECOLOGY ANNUAL PREVENTATIVE CARE ENCOUNTER NOTE  Subjective:   Megan Roman is a 42 y.o. G56P2002 female here for a routine annual gynecologic exam.  Current complaints: recurrent BV infections. Denies abnormal vaginal bleeding, pelvic pain, problems with intercourse or other gynecologic concerns.    Gynecologic History No LMP recorded. Patient has had an ablation. Contraception: ablation Last Pap: 01/2017. Results were: normal with negative HPV Last mammogram: 01/2017. Results were: normal  Obstetric History OB History  Gravida Para Term Preterm AB Living  2 2 2     2   SAB TAB Ectopic Multiple Live Births               # Outcome Date GA Lbr Len/2nd Weight Sex Delivery Anes PTL Lv  2 Term      CS-LTranv     1 Term      CS-LTranv       Past Medical History:  Diagnosis Date  . Asthma   . PMDD (premenstrual dysphoric disorder)   . Seasonal allergies     Past Surgical History:  Procedure Laterality Date  . ABLATION    . CESAREAN SECTION  10/2007  . CESAREAN SECTION  01/2012  . TUBAL LIGATION  01/2012    Current Outpatient Medications on File Prior to Visit  Medication Sig Dispense Refill  . lisdexamfetamine (VYVANSE) 30 MG capsule Take 1 capsule (30 mg total) by mouth every morning. 30 capsule 0  . amphetamine-dextroamphetamine (ADDERALL) 10 MG tablet Take 1 tablet (10 mg total) by mouth daily. (Patient not taking: Reported on 03/12/2018) 30 tablet 0  . ketoconazole (NIZORAL) 2 % cream Apply 1 application topically daily. Continue 2-3 days after resolution of rash (Patient not taking: Reported on 03/12/2018) 15 g 1   No current facility-administered medications on file prior to visit.     Allergies  Allergen Reactions  . Celexa [Citalopram Hydrobromide]     nausea  . Buspar [Buspirone]     nausea    Social History:  reports that she has never smoked. She has never used smokeless tobacco. She reports that she drinks alcohol. She reports that she does not  use drugs.  Family History  Problem Relation Age of Onset  . Alcohol abuse Paternal Grandfather   . Depression Mother   . Hypertension Mother   . Hyperlipidemia Mother   . Asthma Mother   . Diabetes Unknown        paternal grandparents  . Hyperlipidemia Father   . Hypertension Father   . Cancer Paternal Grandmother        thyroid cancer     The following portions of the patient's history were reviewed and updated as appropriate: allergies, current medications, past family history, past medical history, past social history, past surgical history and problem list.  Review of Systems Pertinent items noted in HPI and remainder of comprehensive ROS otherwise negative.   Objective:  BP 99/68   Pulse 78   Ht 5\' 4"  (1.626 m)   Wt 138 lb (62.6 kg)   BMI 23.69 kg/m  CONSTITUTIONAL: Well-developed, well-nourished female in no acute distress.  HENT:  Normocephalic, atraumatic, External right and left ear normal. Oropharynx is clear and moist EYES: Conjunctivae and EOM are normal. Pupils are equal, round, and reactive to light.  NECK: Normal range of motion, supple, no masses.  Normal thyroid.  SKIN: Skin is warm and dry. No rash noted. Not diaphoretic. No erythema. No pallor. MUSCULOSKELETAL: Normal range of motion. No tenderness.  No cyanosis, clubbing, or edema.  2+ distal pulses. NEUROLOGIC: Alert and oriented to person, place, and time. Normal reflexes, muscle tone coordination. No cranial nerve deficit noted. PSYCHIATRIC: Normal mood and affect. Normal behavior. Normal judgment and thought content. CARDIOVASCULAR: Normal heart rate noted, regular rhythm RESPIRATORY: Clear to auscultation bilaterally. Effort and breath sounds normal, no problems with respiration noted. BREASTS: Symmetric in size. No masses, skin changes, nipple drainage, or lymphadenopathy. ABDOMEN: Soft, normal bowel sounds, no distention noted.  No tenderness, rebound or guarding.  PELVIC: Normal appearing external  genitalia; normal appearing vaginal mucosa and cervix.  Small amount of white thin discharge with odor present.  Pap smear obtained.  Normal uterine size, no other palpable masses, no uterine or adnexal tenderness.  Assessment and Plan:  1. Encounter for annual routine gynecological examination - Patient doing well, normal examination  - Cytology - PAP - MM 3D SCREEN BREAST BILATERAL; Future  2. BV (bacterial vaginosis) - Small amount of white thin discharge with odor present, patient has hx of recurrent BV and reports pH always being off balance  - Educated and discussed treatment for recurrent BV with boric acid, patient agrees to plan of care  - tinidazole (TINDAMAX) 500 MG tablet; Take 4 tablets (2,000 mg total) by mouth daily with breakfast. For two days  Dispense: 8 tablet; Refill: 0 - Boric Acid CRYS; Place 600 mg vaginally at bedtime. Use vaginally every night for two weeks then twice a week for 3 months for recurrent BV  Dispense: 500 g; Refill: 3  Will follow up results of pap smear and manage accordingly. Mammogram scheduled Routine preventative health maintenance measures emphasized. Please refer to After Visit Summary for other counseling recommendations.    Sharyon CableVeronica C Natanel Snavely, CNM [facility], [facility]

## 2018-03-12 NOTE — Patient Instructions (Addendum)
Recurrent BV: Vaginal boric acid 600 mg nightly for two weeks, then twice weekly for 3 months. After Tindamax.    Bacterial Vaginosis Bacterial vaginosis is an infection of the vagina. It happens when too many germs (bacteria) grow in the vagina. This infection puts you at risk for infections from sex (STIs). Treating this infection can lower your risk for some STIs. You should also treat this if you are pregnant. It can cause your baby to be born early. Follow these instructions at home: Medicines  Take over-the-counter and prescription medicines only as told by your doctor.  Take or use your antibiotic medicine as told by your doctor. Do not stop taking or using it even if you start to feel better. General instructions  If you your sexual partner is a woman, tell her that you have this infection. She needs to get treatment if she has symptoms. If you have a female partner, he does not need to be treated.  During treatment: ? Avoid sex. ? Do not douche. ? Avoid alcohol as told. ? Avoid breastfeeding as told.  Drink enough fluid to keep your pee (urine) clear or pale yellow.  Keep your vagina and butt (rectum) clean. ? Wash the area with warm water every day. ? Wipe from front to back after you use the toilet.  Keep all follow-up visits as told by your doctor. This is important. Preventing this condition  Do not douche.  Use only warm water to wash around your vagina.  Use protection when you have sex. This includes: ? Latex condoms. ? Dental dams.  Limit how many people you have sex with. It is best to only have sex with the same person (be monogamous).  Get tested for STIs. Have your partner get tested.  Wear underwear that is cotton or lined with cotton.  Avoid tight pants and pantyhose. This is most important in summer.  Do not use any products that have nicotine or tobacco in them. These include cigarettes and e-cigarettes. If you need help quitting, ask your  doctor.  Do not use illegal drugs.  Limit how much alcohol you drink. Contact a doctor if:  Your symptoms do not get better, even after you are treated.  You have more discharge or pain when you pee (urinate).  You have a fever.  You have pain in your belly (abdomen).  You have pain with sex.  Your bleed from your vagina between periods. Summary  This infection happens when too many germs (bacteria) grow in the vagina.  Treating this condition can lower your risk for some infections from sex (STIs).  You should also treat this if you are pregnant. It can cause early (premature) birth.  Do not stop taking or using your antibiotic medicine even if you start to feel better. This information is not intended to replace advice given to you by your health care provider. Make sure you discuss any questions you have with your health care provider. Document Released: 01/25/2008 Document Revised: 01/01/2016 Document Reviewed: 01/01/2016 Elsevier Interactive Patient Education  2017 [employer]

## 2018-03-14 LAB — CYTOLOGY - PAP
Adequacy: ABSENT
Bacterial vaginitis: POSITIVE — AB
Candida vaginitis: NEGATIVE
Diagnosis: NEGATIVE
HPV: NOT DETECTED

## 2018-03-25 MED FILL — VYVANSE 60 MG CHEW: 60 | 30 days supply | Qty: 30 | Fill #0

## 2018-03-25 MED FILL — buPROPion HCL ER (XL) 150 M: 150 | 30 days supply | Qty: 30 | Fill #1

## 2018-03-27 ENCOUNTER — Ambulatory Visit (INDEPENDENT_AMBULATORY_CARE_PROVIDER_SITE_OTHER): Payer: 59

## 2018-03-27 DIAGNOSIS — Z1231 Encounter for screening mammogram for malignant neoplasm of breast: Secondary | ICD-10-CM | POA: Diagnosis not present

## 2018-03-27 DIAGNOSIS — Z01419 Encounter for gynecological examination (general) (routine) without abnormal findings: Secondary | ICD-10-CM

## 2018-04-22 MED FILL — buPROPion HCL ER (XL) 150 M: 150 | 30 days supply | Qty: 30 | Fill #2

## 2018-04-22 MED FILL — VYVANSE 60 MG CHEW: 60 | 30 days supply | Qty: 30 | Fill #0

## 2018-05-13 DIAGNOSIS — F419 Anxiety disorder, unspecified: Secondary | ICD-10-CM | POA: Diagnosis not present

## 2018-05-13 DIAGNOSIS — F902 Attention-deficit hyperactivity disorder, combined type: Secondary | ICD-10-CM | POA: Diagnosis not present

## 2018-05-13 DIAGNOSIS — Z79899 Other long term (current) drug therapy: Secondary | ICD-10-CM | POA: Diagnosis not present

## 2018-05-16 MED FILL — buPROPion HCL ER (XL) 150 M: 150 | 30 days supply | Qty: 60 | Fill #0

## 2018-05-21 MED FILL — VYVANSE 60 MG CHEW: 60 | 30 days supply | Qty: 30 | Fill #0

## 2018-06-20 MED FILL — VYVANSE 60 MG CHEW: 60 | 30 days supply | Qty: 30 | Fill #0

## 2018-06-27 MED FILL — buPROPion HCL ER (XL) 150 M: 150 | 30 days supply | Qty: 60 | Fill #1

## 2018-07-17 MED FILL — VYVANSE 60 MG CHEW: 60 | 30 days supply | Qty: 30 | Fill #0

## 2018-08-08 DIAGNOSIS — F419 Anxiety disorder, unspecified: Secondary | ICD-10-CM | POA: Diagnosis not present

## 2018-08-08 DIAGNOSIS — Z79899 Other long term (current) drug therapy: Secondary | ICD-10-CM | POA: Diagnosis not present

## 2018-08-08 DIAGNOSIS — F902 Attention-deficit hyperactivity disorder, combined type: Secondary | ICD-10-CM | POA: Diagnosis not present

## 2018-08-08 MED FILL — buPROPion HCL ER (XL) 150 M: 150 | 90 days supply | Qty: 180 | Fill #0

## 2018-08-14 MED FILL — VYVANSE 60 MG CHEW: 60 | 90 days supply | Qty: 90 | Fill #0

## 2018-10-31 ENCOUNTER — Encounter: Payer: Self-pay | Admitting: Osteopathic Medicine

## 2018-10-31 ENCOUNTER — Ambulatory Visit (INDEPENDENT_AMBULATORY_CARE_PROVIDER_SITE_OTHER): Payer: 59 | Admitting: Osteopathic Medicine

## 2018-10-31 ENCOUNTER — Other Ambulatory Visit: Payer: Self-pay

## 2018-10-31 VITALS — BP 102/61 | HR 71 | Temp 98.1°F | Wt 136.8 lb

## 2018-10-31 DIAGNOSIS — R1011 Right upper quadrant pain: Secondary | ICD-10-CM

## 2018-10-31 DIAGNOSIS — Z0000 Encounter for general adult medical examination without abnormal findings: Secondary | ICD-10-CM | POA: Diagnosis not present

## 2018-10-31 DIAGNOSIS — N926 Irregular menstruation, unspecified: Secondary | ICD-10-CM | POA: Diagnosis not present

## 2018-10-31 NOTE — Progress Notes (Signed)
HPI: Megan Roman is a 43 y.o. female who  has a past medical history of Asthma, PMDD (premenstrual dysphoric disorder), and Seasonal allergies.  she presents to [facility] today, 10/31/18,  for chief complaint of: Annual physical     Patient here for annual physical / wellness exam.  See preventive care reviewed as below.    Additional concerns today include: abdominal pain/bloating . Location: upper abdomen, under ribs, worse on right . Quality: bloating  . Duration: few months . Timing: intermittent . Modifying factors: nothing makes better or worse . Assoc signs/symptoms: lighter colored stool, occasional loose stool    Past medical, surgical, social and family history reviewed:  Patient Active Problem List   Diagnosis Date Noted  . Generalized anxiety disorder 07/07/2015  . PMDD (premenstrual dysphoric disorder) 04/09/2013  . Attention and concentration deficit 04/09/2013    Past Surgical History:  Procedure Laterality Date  . ABLATION    . CESAREAN SECTION  10/2007  . CESAREAN SECTION  01/2012  . TUBAL LIGATION  01/2012    Social History   Tobacco Use  . Smoking status: Never Smoker  . Smokeless tobacco: Never Used  Substance Use Topics  . Alcohol use: Yes    Comment: less than 1 a week    Family History  Problem Relation Age of Onset  . Alcohol abuse Paternal Grandfather   . Depression Mother   . Hypertension Mother   . Hyperlipidemia Mother   . Asthma Mother   . Diabetes Unknown        paternal grandparents  . Hyperlipidemia Father   . Hypertension Father   . Cancer Paternal Grandmother        thyroid cancer      Current medication list and allergy/intolerance information reviewed:    Current Outpatient Medications  Medication Sig Dispense Refill  . amphetamine-dextroamphetamine (ADDERALL) 10 MG tablet Take 1 tablet (10 mg total) by mouth daily. (Patient not taking: Reported on 03/12/2018) 30 tablet 0   . Boric Acid CRYS Place 600 mg vaginally at bedtime. Use vaginally every night for two weeks then twice a week for 3 months for recurrent BV 500 g 3  . ketoconazole (NIZORAL) 2 % cream Apply 1 application topically daily. Continue 2-3 days after resolution of rash (Patient not taking: Reported on 03/12/2018) 15 g 1  . lisdexamfetamine (VYVANSE) 30 MG capsule Take 1 capsule (30 mg total) by mouth every morning. 30 capsule 0  . tinidazole (TINDAMAX) 500 MG tablet Take 4 tablets (2,000 mg total) by mouth daily with breakfast. For two days 8 tablet 0   No current facility-administered medications for this visit.     Allergies  Allergen Reactions  . Celexa [Citalopram Hydrobromide]     nausea  . Buspar [Buspirone]     nausea      Review of Systems:  Constitutional:  No  fever, no chills, No recent illness, No unintentional weight changes. No significant fatigue.   HEENT: No  headache, no vision change, no hearing change, No sore throat, No  sinus pressure  Cardiac: No  chest pain, No  pressure, No palpitations, No  Orthopnea  Respiratory:  No  shortness of breath. No  Cough  Gastrointestinal: +abdominal pain, No  nausea, No  vomiting,  No  blood in stool, +diarrhea, No  constipation   Musculoskeletal: No new myalgia/arthralgia  Skin: No  Rash, No other wounds/concerning lesions  Genitourinary: No  incontinence, No  abnormal genital bleeding,  No abnormal genital discharge  Hem/Onc: No  easy bruising/bleeding, No  abnormal lymph node  Endocrine: No cold intolerance,  No heat intolerance. No polyuria/polydipsia/polyphagia   Neurologic: No  weakness, No  dizziness, No  slurred speech/focal weakness/facial droop  Psychiatric: No  concerns with depression, No  concerns with anxiety, No sleep problems, No mood problems  Exam:  BP 102/61 (BP Location: Left Arm, Patient Position: Sitting, Cuff Size: Normal)   Pulse 71   Temp 98.1 F (36.7 C) (Oral)   Wt 136 lb 12.8 oz (62.1 kg)    BMI 23.48 kg/m   Constitutional: VS see above. General Appearance: alert, well-developed, well-nourished, NAD  Eyes: Normal lids and conjunctive, non-icteric sclera  Neck: No masses, trachea midline. No thyroid enlargement. No tenderness/mass appreciated. No lymphadenopathy  Respiratory: Normal respiratory effort. no wheeze, no rhonchi, no rales  Cardiovascular: S1/S2 normal, no murmur, no rub/gallop auscultated. RRR. No lower extremity edema. Pedal pulse II/IV bilaterally DP and PT. No carotid bruit or JVD. No abdominal aortic bruit.  Gastrointestinal: (+)RUQ tenderness, no masses. No hepatomegaly, no splenomegaly. No hernia appreciated. Bowel sounds normal. Rectal exam deferred.   Musculoskeletal: Gait normal. No clubbing/cyanosis of digits.   Neurological: Normal balance/coordination. No tremor. No cranial nerve deficit on limited exam. Motor and sensation intact and symmetric. Cerebellar reflexes intact.   Skin: warm, dry, intact. No rash/ulcer. No concerning nevi or subq nodules on limited exam.    Psychiatric: Normal judgment/insight. Normal mood and affect. Oriented x3.      ASSESSMENT/PLAN: The primary encounter diagnosis was Annual physical exam. A diagnosis of RUQ abdominal pain was also pertinent to this visit.   Orders Placed This Encounter  Procedures  . US ABDOMEN LIMITED RUQ  . CBC with Differential/Platelet  . COMPLETE METABOLIC PANEL WITH GFR  . Lipid panel  . TSH  . VITAMIN D 25 Hydroxy (Vit-D Deficiency, Fractures)  . Gamma GT  . Lipase    No orders of the defined types were placed in this encounter.   Patient Instructions  General Preventive Care  Most recent routine screening lipids/other labs: ordered today.   Tobacco: don't!   Alcohol: responsible moderation is ok for most adults - if you have concerns about your alcohol intake, please talk to me!   Exercise: as tolerated to reduce risk of cardiovascular disease and diabetes. Strength  training will also prevent osteoporosis.   Mental health: if need for mental health care (medicines, counseling, other), or concerns about moods, please let me know!   Sexual health: if need for STD testing, or if concerns with libido/pain problems, please let me know!   Advanced Directive: Living Will and/or Healthcare Power of Attorney recommended for all adults, regardless of age or health.  Vaccines  Flu vaccine: recommended for almost everyone, every fall.   Shingles vaccine: Shingrix recommended after age 42.   Pneumonia vaccines: Prevnar and Pneumovax recommended after age 42, or sooner if certain medical conditions.  Tetanus booster: Tdap recommended every 10 years, done 2019 Cancer screenings   Colon cancer screening: recommended for everyone at age 42-75  Breast cancer screening: mammogram recommended at age 42 every other year at least, and annually after age 42.   Cervical cancer screening: Pap every 1 to 5 years depending on age and other risk factors. Can usually stop at age 42 or w/ hysterectomy.   Lung cancer screening: not needed for non-smokers  Infection screenings . HIV: recommended screening at least once age 42-65, more often as needed. .Marland Kitchen  Gonorrhea/Chlamydia: screening as needed . Hepatitis C: recommended for anyone born [id number] . TB: certain at-risk populations, or depending on work requirements and/or travel history Other  Bone Density Test: recommended for women at age 42           Visit summary with medication list and pertinent instructions was printed for patient to review. All questions at time of visit were answered - patient instructed to contact office with any additional concerns or updates. ER/RTC precautions were reviewed with the patient.     Please note: voice recognition software was used to produce this document, and typos may escape review. Please contact Dr. Lyn HollingsheadAlexander for any needed clarifications.     Follow-up plan:  Return in about 1 year (around 10/31/2019) for [facility] (call week prior to visit for lab orders).

## 2018-10-31 NOTE — Patient Instructions (Addendum)
General Preventive Care  Most recent routine screening lipids/other labs: ordered today.   Tobacco: don't!   Alcohol: responsible moderation is ok for most adults - if you have concerns about your alcohol intake, please talk to me!   Exercise: as tolerated to reduce risk of cardiovascular disease and diabetes. Strength training will also prevent osteoporosis.   Mental health: if need for mental health care (medicines, counseling, other), or concerns about moods, please let me know!   Sexual health: if need for STD testing, or if concerns with libido/pain problems, please let me know!   Advanced Directive: Living Will and/or Healthcare Power of Attorney recommended for all adults, regardless of age or health.  Vaccines  Flu vaccine: recommended for almost everyone, every fall.   Shingles vaccine: Shingrix recommended after age 80.   Pneumonia vaccines: Prevnar and Pneumovax recommended after age 48, or sooner if certain medical conditions.  Tetanus booster: Tdap recommended every 10 years, done 2019 Cancer screenings   Colon cancer screening: recommended for everyone at age 20-75  Breast cancer screening: mammogram recommended at age 26 every other year at least, and annually after age 26.   Cervical cancer screening: Pap every 1 to 5 years depending on age and other risk factors. Can usually stop at age 43 or w/ hysterectomy.   Lung cancer screening: not needed for non-smokers  Infection screenings . HIV: recommended screening at least once age 57-65, more often as needed. . Gonorrhea/Chlamydia: screening as needed . Hepatitis C: recommended for anyone born 33-1964 . TB: certain at-risk populations, or depending on work requirements and/or travel history Other  Bone Density Test: recommended for women at age 80

## 2018-10-31 NOTE — Addendum Note (Signed)
Addended by: Maryla Morrow on: 10/31/2018 09:45 AM   Modules accepted: Orders

## 2018-11-01 LAB — LIPASE: Lipase: 23 U/L (ref 7–60)

## 2018-11-01 LAB — CBC WITH DIFFERENTIAL/PLATELET
Absolute Monocytes: 395 cells/uL (ref 200–950)
Basophils Absolute: 42 cells/uL (ref 0–200)
Basophils Relative: 0.8 %
Eosinophils Absolute: 182 cells/uL (ref 15–500)
Eosinophils Relative: 3.5 %
HCT: 40.5 % (ref 35.0–45.0)
Hemoglobin: 13.6 g/dL (ref 11.7–15.5)
Lymphs Abs: 1404 cells/uL (ref 850–3900)
MCH: 33.3 pg — ABNORMAL HIGH (ref 27.0–33.0)
MCHC: 33.6 g/dL (ref 32.0–36.0)
MCV: 99.3 fL (ref 80.0–100.0)
MPV: 13.4 fL — ABNORMAL HIGH (ref 7.5–12.5)
Monocytes Relative: 7.6 %
Neutro Abs: 3177 cells/uL (ref 1500–7800)
Neutrophils Relative %: 61.1 %
Platelets: 228 10*3/uL (ref 140–400)
RBC: 4.08 10*6/uL (ref 3.80–5.10)
RDW: 12.2 % (ref 11.0–15.0)
Total Lymphocyte: 27 %
WBC: 5.2 10*3/uL (ref 3.8–10.8)

## 2018-11-01 LAB — LIPID PANEL
Cholesterol: 166 mg/dL (ref <200)
HDL: 60 mg/dL (ref >=50)
LDL Cholesterol (Calc): 94 mg/dL (calc)
Non-HDL Cholesterol (Calc): 106 mg/dL (calc) (ref <130)
Total CHOL/HDL Ratio: 2.8 (calc) (ref <5.0)
Triglycerides: 42 mg/dL (ref <150)

## 2018-11-01 LAB — COMPLETE METABOLIC PANEL WITH GFR
AG Ratio: 2.1 (calc) (ref 1.0–2.5)
ALT: 18 U/L (ref 6–29)
AST: 14 U/L (ref 10–30)
Albumin: 4.2 g/dL (ref 3.6–5.1)
Alkaline phosphatase (APISO): 43 U/L (ref 31–125)
BUN: 15 mg/dL (ref 7–25)
CO2: 29 mmol/L (ref 20–32)
Calcium: 9.4 mg/dL (ref 8.6–10.2)
Chloride: 104 mmol/L (ref 98–110)
Creat: 0.82 mg/dL (ref 0.50–1.10)
GFR, Est African American: 102 mL/min/{1.73_m2} (ref >=60)
GFR, Est Non African American: 88 mL/min/{1.73_m2} (ref >=60)
Globulin: 2 g/dL (calc) (ref 1.9–3.7)
Glucose, Bld: 88 mg/dL (ref 65–99)
Potassium: 4.5 mmol/L (ref 3.5–5.3)
Sodium: 138 mmol/L (ref 135–146)
Total Bilirubin: 0.5 mg/dL (ref 0.2–1.2)
Total Protein: 6.2 g/dL (ref 6.1–8.1)

## 2018-11-01 LAB — TSH: TSH: 1.13 mIU/L

## 2018-11-01 LAB — VITAMIN D 25 HYDROXY (VIT D DEFICIENCY, FRACTURES): Vit D, 25-Hydroxy: 56 ng/mL (ref 30–100)

## 2018-11-01 LAB — GAMMA GT: GGT: 10 U/L (ref 3–55)

## 2018-11-06 ENCOUNTER — Other Ambulatory Visit: Payer: Self-pay

## 2018-11-06 ENCOUNTER — Ambulatory Visit (INDEPENDENT_AMBULATORY_CARE_PROVIDER_SITE_OTHER): Payer: 59

## 2018-11-06 DIAGNOSIS — R1011 Right upper quadrant pain: Secondary | ICD-10-CM

## 2018-11-06 DIAGNOSIS — R748 Abnormal levels of other serum enzymes: Secondary | ICD-10-CM | POA: Diagnosis not present

## 2018-11-06 DIAGNOSIS — K8020 Calculus of gallbladder without cholecystitis without obstruction: Secondary | ICD-10-CM | POA: Diagnosis not present

## 2018-11-18 DIAGNOSIS — F419 Anxiety disorder, unspecified: Secondary | ICD-10-CM | POA: Diagnosis not present

## 2018-11-18 DIAGNOSIS — Z79899 Other long term (current) drug therapy: Secondary | ICD-10-CM | POA: Diagnosis not present

## 2018-11-18 DIAGNOSIS — F902 Attention-deficit hyperactivity disorder, combined type: Secondary | ICD-10-CM | POA: Diagnosis not present

## 2018-11-18 MED FILL — VYVANSE 60 MG CHEW: 60 | 90 days supply | Qty: 90 | Fill #0

## 2018-12-04 ENCOUNTER — Ambulatory Visit: Payer: Self-pay | Admitting: General Surgery

## 2018-12-04 DIAGNOSIS — K8020 Calculus of gallbladder without cholecystitis without obstruction: Secondary | ICD-10-CM | POA: Diagnosis not present

## 2018-12-04 NOTE — H&P (Signed)
Megan SalmAmanda T Grieves Documented: 12/04/2018 10:28 AM Location: [facility] Patient #: [id number] DOB: 04/29/1976 Married / Language: [place] / Race: White Female  History of Present Illness Megan Roman(Karam Dunson M. Jola Critzer MD; 12/04/2018 11:07 AM) The patient is a 43 year old female who presents for evaluation of gall stones. She comes in with complaints of chronic right upper quadrant pain. I talked to her in her office a few weeks ago regarding her right upper quadrant pain. She is had spasming in her right upper quadrant generally at night 4. Since that it is gotten more consistent and more frequent she states. She will have bloating with these episodes. Most of these episodes occur after eating. For instance she had an episode after eating spaghetti and meat sauce. Initially when she started having these episodes a year or 2 ago she blamed it on costochondritis. She will have some radiation of the pain to her right lower quadrant but no radiation to her side or back. Rare nausea. No vomiting. Daily bowel movements. No acholic stools. She had an episode the other week that caused to have fatigue and lightheadedness and palpitations with that episode. She had labs a few weeks ago which were unremarkable. She had an abdominal ultrasound which showed a 1.3 cm gallstone.   Problem List/Past Medical Megan Roman(Grason Brailsford M. Andrey CampanileWilson, MD; 12/04/2018 11:07 AM) SYMPTOMATIC CHOLELITHIASIS (K80.20)  Past Surgical History Renee Ramus(Armen Ferguson, CMA; 12/04/2018 10:28 AM) Cesarean Section - Multiple Oral Surgery  Diagnostic Studies History (Armen Emelda FearFerguson, CMA; 12/04/2018 10:28 AM) Mammogram within last year Pap Smear 1-5 years ago  Allergies Renee Ramus(Armen Ferguson, CMA; 12/04/2018 10:29 AM) No Known Drug Allergies [12/04/2018]:  Medication History (Armen Ferguson, CMA; 12/04/2018 10:30 AM) buPROPion HCl ER (XL) (150MG  Tablet ER 24HR, Oral) Active. Vyvanse (60MG  Tablet Chewable, Oral) Active. Medications Reconciled  Social  History Renee Ramus(Armen Ferguson, CMA; 12/04/2018 10:28 AM) Alcohol use Occasional alcohol use. Caffeine use Coffee. No drug use Tobacco use Never smoker.  Family History Renee Ramus(Armen Ferguson, CMA; 12/04/2018 10:28 AM) Alcohol Abuse Family Members In General. Anesthetic complications Family Members In General. Arthritis Mother. Cancer Mother. Depression Mother, Sister. Diabetes Mellitus Family Members In General. Heart Disease Family Members In General. Hypertension Father, Mother. Migraine Headache Father. Respiratory Condition Family Members In General. Thyroid problems Family Members In General.  Pregnancy / Birth History Renee Ramus(Armen Ferguson, CMA; 12/04/2018 10:28 AM) Age at menarche 13 years. Contraceptive History Intrauterine device, Oral contraceptives. Gravida 2 Irregular periods Length (months) of breastfeeding 3-6 Maternal age 42-30 Para 2  Other Problems Megan Roman(Quantez Schnyder M. Andrey CampanileWilson, MD; 12/04/2018 11:07 AM) Anxiety Disorder Asthma Back Pain Cholelithiasis Gastroesophageal Reflux Disease Migraine Headache Other disease, cancer, significant illness     Review of Systems (Armen Ferguson CMA; 12/04/2018 10:28 AM) General Present- Fatigue. Not Present- Appetite Loss, Chills, Fever, Night Sweats, Weight Gain and Weight Loss. Skin Not Present- Change in Wart/Mole, Dryness, Hives, Jaundice, New Lesions, Non-Healing Wounds, Rash and Ulcer. HEENT Present- Seasonal Allergies and Wears glasses/contact lenses. Not Present- Earache, Hearing Loss, Hoarseness, Nose Bleed, Oral Ulcers, Ringing in the Ears, Sinus Pain, Sore Throat, Visual Disturbances and Yellow Eyes. Respiratory Present- Difficulty Breathing. Not Present- Bloody sputum, Chronic Cough, Snoring and Wheezing. Breast Not Present- Breast Mass, Breast Pain, Nipple Discharge and Skin Changes. Cardiovascular Present- Palpitations, Rapid Heart Rate and Shortness of Breath. Not Present- Chest Pain, Difficulty Breathing Lying Down,  Leg Cramps and Swelling of Extremities. Gastrointestinal Present- Abdominal Pain, Bloating, Bloody Stool, Change in Bowel Habits, Constipation, Excessive gas and Gets full quickly at meals. Not  Present- Chronic diarrhea, Difficulty Swallowing, Hemorrhoids, Indigestion, Nausea, Rectal Pain and Vomiting. Female Genitourinary Present- Pelvic Pain. Not Present- Frequency, Nocturia, Painful Urination and Urgency. Musculoskeletal Present- Back Pain and Swelling of Extremities. Not Present- Joint Pain, Joint Stiffness, Muscle Pain and Muscle Weakness. Neurological Present- Headaches and Weakness. Not Present- Decreased Memory, Fainting, Numbness, Seizures, Tingling, Tremor and Trouble walking. Psychiatric Present- Anxiety. Not Present- Bipolar, Change in Sleep Pattern, Depression, Fearful and Frequent crying. Endocrine Not Present- Cold Intolerance, Excessive Hunger, Hair Changes, Heat Intolerance, Hot flashes and New Diabetes. Hematology Present- Easy Bruising. Not Present- Blood Thinners, Excessive bleeding, Gland problems, HIV and Persistent Infections.  Vitals (Armen Ferguson CMA; 12/04/2018 10:29 AM) 12/04/2018 10:29 AM Weight: 136.38 lb Height: 64.5in Body Surface Area: 1.67 m Body Mass Index: 23.05 kg/m  Temp.: 98.49F  Pulse: 107 (Regular)  P.OX: 99% (Room air) BP: 120/90 (Sitting, Left Arm, Standard)        Physical Exam Randall Hiss M. Pema Thomure MD; 12/04/2018 11:04 AM)  General Mental Status-Alert. General Appearance-Consistent with stated age. Hydration-Well hydrated. Voice-Normal.  Head and Neck Head-normocephalic, atraumatic with no lesions or palpable masses. Trachea-midline. Thyroid Gland Characteristics - normal size and consistency.  Eye Eyeball - Bilateral-Extraocular movements intact. Sclera/Conjunctiva - Bilateral-No scleral icterus.  ENMT Ears Pinna - Bilateral - no bony growth in lateral aspect of ear canal, no edema. Nose and Sinuses External  Inspection of the Nose - symmetric.  Chest and Lung Exam Chest and lung exam reveals -quiet, even and easy respiratory effort with no use of accessory muscles and on auscultation, normal breath sounds, no adventitious sounds and normal vocal resonance. Inspection Chest Wall - Normal. Back - normal.  Breast - Did not examine.  Cardiovascular Cardiovascular examination reveals -normal heart sounds, regular rate and rhythm with no murmurs and normal pedal pulses bilaterally.  Abdomen Inspection Inspection of the abdomen reveals - No Hernias. Skin - Scar - no surgical scars. Palpation/Percussion Palpation and Percussion of the abdomen reveal - Soft, Non Tender, No Rebound tenderness, No Rigidity (guarding) and No hepatosplenomegaly. Auscultation Auscultation of the abdomen reveals - Bowel sounds normal.  Peripheral Vascular Upper Extremity Palpation - Pulses bilaterally normal.  Neurologic Neurologic evaluation reveals -alert and oriented x 3 with no impairment of recent or remote memory. Mental Status-Normal.  Neuropsychiatric The patient's mood and affect are described as -normal. Judgment and Insight-insight is appropriate concerning matters relevant to self.  Musculoskeletal Normal Exam - Left-Upper Extremity Strength Normal and Lower Extremity Strength Normal. Normal Exam - Right-Upper Extremity Strength Normal and Lower Extremity Strength Normal.  Lymphatic Head & Neck  General Head & Neck Lymphatics: Bilateral - Description - Normal. Axillary - Did not examine. Femoral & Inguinal - Did not examine.    Assessment & Plan Randall Hiss M. Hillel Card MD; 12/04/2018 11:03 AM)  SYMPTOMATIC CHOLELITHIASIS (K80.20) Impression: I believe the patient's symptoms are consistent with gallbladder disease.  We discussed gallbladder disease. The patient was given Neurosurgeon. We discussed non-operative and operative management. We discussed the signs & symptoms of  acute cholecystitis  I discussed laparoscopic cholecystectomy with IOC in detail. The patient was given educational material as well as diagrams detailing the procedure. We discussed the risks and benefits of a laparoscopic cholecystectomy including, but not limited to bleeding, infection, injury to surrounding structures such as the intestine or liver, bile leak, retained gallstones, need to convert to an open procedure, prolonged diarrhea, blood clots such as DVT, common bile duct injury, anesthesia risks, and possible need for additional procedures. We  discussed the typical post-operative recovery course. I explained that the likelihood of improvement of their symptoms is good.  The patient has elected to proceed with surgery.  We also discussed operating during the coronavirus pandemic. We discussed that she be tested for Covid preoperatively. We discussed the importance of social isolation between her tests and surgery  Current Plans Pt Education - Pamphlet Given - Laparoscopic Gallbladder Surgery: discussed with patient and provided information. You are being scheduled for surgery- Our schedulers will call you.  You should hear from our office's scheduling department within 5 working days about the location, date, and time of surgery. We try to make accommodations for patient's preferences in scheduling surgery, but sometimes the OR schedule or the surgeon's schedule prevents us from making those accommodations.  If you have not heard from our office [phone]) in 5 working days, call the office and ask for your surgeon's nurse.  If you have other questions about your diagnosis, plan, or surgery, call the office and ask for your surgeon's nurse.  Mary SellaEric M. Andrey CampanileWilson, MD, FACS General, Bariatric, & Minimally Invasive Surgery [facility] [place]

## 2018-12-04 NOTE — H&P (View-Only) (Signed)
Megan Roman Documented: 12/04/2018 10:28 AM Location: [facility] Patient #: [id number] DOB: 12/04/1975 Married / Language: English / Race: White Female  History of Present Illness (Sharisa Toves M. Dawsen Krieger MD; 12/04/2018 11:07 AM) The patient is a 43 year old female who presents for evaluation of gall stones. She comes in with complaints of chronic right upper quadrant pain. I talked to her in her office a few weeks ago regarding her right upper quadrant pain. She is had spasming in her right upper quadrant generally at night 4. Since that it is gotten more consistent and more frequent she states. She will have bloating with these episodes. Most of these episodes occur after eating. For instance she had an episode after eating spaghetti and meat sauce. Initially when she started having these episodes a year or 2 ago she blamed it on costochondritis. She will have some radiation of the pain to her right lower quadrant but no radiation to her side or back. Rare nausea. No vomiting. Daily bowel movements. No acholic stools. She had an episode the other week that caused to have fatigue and lightheadedness and palpitations with that episode. She had labs a few weeks ago which were unremarkable. She had an abdominal ultrasound which showed a 1.3 cm gallstone.   Problem List/Past Medical (Icholas Irby M. Amee Boothe, MD; 12/04/2018 11:07 AM) SYMPTOMATIC CHOLELITHIASIS (K80.20)  Past Surgical History (Armen Ferguson, CMA; 12/04/2018 10:28 AM) Cesarean Section - Multiple Oral Surgery  Diagnostic Studies History (Armen Ferguson, CMA; 12/04/2018 10:28 AM) Mammogram within last year Pap Smear 1-5 years ago  Allergies (Armen Ferguson, CMA; 12/04/2018 10:29 AM) No Known Drug Allergies [12/04/2018]:  Medication History (Armen Ferguson, CMA; 12/04/2018 10:30 AM) buPROPion HCl ER (XL) (150MG Tablet ER 24HR, Oral) Active. Vyvanse (60MG Tablet Chewable, Oral) Active. Medications Reconciled  Social  History (Armen Ferguson, CMA; 12/04/2018 10:28 AM) Alcohol use Occasional alcohol use. Caffeine use Coffee. No drug use Tobacco use Never smoker.  Family History (Armen Ferguson, CMA; 12/04/2018 10:28 AM) Alcohol Abuse Family Members In General. Anesthetic complications Family Members In General. Arthritis Mother. Cancer Mother. Depression Mother, Sister. Diabetes Mellitus Family Members In General. Heart Disease Family Members In General. Hypertension Father, Mother. Migraine Headache Father. Respiratory Condition Family Members In General. Thyroid problems Family Members In General.  Pregnancy / Birth History (Armen Ferguson, CMA; 12/04/2018 10:28 AM) Age at menarche 13 years. Contraceptive History Intrauterine device, Oral contraceptives. Gravida 2 Irregular periods Length (months) of breastfeeding 3-6 Maternal age 26-30 Para 2  Other Problems (Cher Franzoni M. Asjah Rauda, MD; 12/04/2018 11:07 AM) Anxiety Disorder Asthma Back Pain Cholelithiasis Gastroesophageal Reflux Disease Migraine Headache Other disease, cancer, significant illness     Review of Systems (Armen Ferguson CMA; 12/04/2018 10:28 AM) General Present- Fatigue. Not Present- Appetite Loss, Chills, Fever, Night Sweats, Weight Gain and Weight Loss. Skin Not Present- Change in Wart/Mole, Dryness, Hives, Jaundice, New Lesions, Non-Healing Wounds, Rash and Ulcer. HEENT Present- Seasonal Allergies and Wears glasses/contact lenses. Not Present- Earache, Hearing Loss, Hoarseness, Nose Bleed, Oral Ulcers, Ringing in the Ears, Sinus Pain, Sore Throat, Visual Disturbances and Yellow Eyes. Respiratory Present- Difficulty Breathing. Not Present- Bloody sputum, Chronic Cough, Snoring and Wheezing. Breast Not Present- Breast Mass, Breast Pain, Nipple Discharge and Skin Changes. Cardiovascular Present- Palpitations, Rapid Heart Rate and Shortness of Breath. Not Present- Chest Pain, Difficulty Breathing Lying Down,  Leg Cramps and Swelling of Extremities. Gastrointestinal Present- Abdominal Pain, Bloating, Bloody Stool, Change in Bowel Habits, Constipation, Excessive gas and Gets full quickly at meals. Not   Present- Chronic diarrhea, Difficulty Swallowing, Hemorrhoids, Indigestion, Nausea, Rectal Pain and Vomiting. Female Genitourinary Present- Pelvic Pain. Not Present- Frequency, Nocturia, Painful Urination and Urgency. Musculoskeletal Present- Back Pain and Swelling of Extremities. Not Present- Joint Pain, Joint Stiffness, Muscle Pain and Muscle Weakness. Neurological Present- Headaches and Weakness. Not Present- Decreased Memory, Fainting, Numbness, Seizures, Tingling, Tremor and Trouble walking. Psychiatric Present- Anxiety. Not Present- Bipolar, Change in Sleep Pattern, Depression, Fearful and Frequent crying. Endocrine Not Present- Cold Intolerance, Excessive Hunger, Hair Changes, Heat Intolerance, Hot flashes and New Diabetes. Hematology Present- Easy Bruising. Not Present- Blood Thinners, Excessive bleeding, Gland problems, HIV and Persistent Infections.  Vitals (Armen Ferguson CMA; 12/04/2018 10:29 AM) 12/04/2018 10:29 AM Weight: 136.38 lb Height: 64.5in Body Surface Area: 1.67 m Body Mass Index: 23.05 kg/m  Temp.: 98.49F  Pulse: 107 (Regular)  P.OX: 99% (Room air) BP: 120/90 (Sitting, Left Arm, Standard)        Physical Exam Randall Hiss M. Supriya Beaston MD; 12/04/2018 11:04 AM)  General Mental Status-Alert. General Appearance-Consistent with stated age. Hydration-Well hydrated. Voice-Normal.  Head and Neck Head-normocephalic, atraumatic with no lesions or palpable masses. Trachea-midline. Thyroid Gland Characteristics - normal size and consistency.  Eye Eyeball - Bilateral-Extraocular movements intact. Sclera/Conjunctiva - Bilateral-No scleral icterus.  ENMT Ears Pinna - Bilateral - no bony growth in lateral aspect of ear canal, no edema. Nose and Sinuses External  Inspection of the Nose - symmetric.  Chest and Lung Exam Chest and lung exam reveals -quiet, even and easy respiratory effort with no use of accessory muscles and on auscultation, normal breath sounds, no adventitious sounds and normal vocal resonance. Inspection Chest Wall - Normal. Back - normal.  Breast - Did not examine.  Cardiovascular Cardiovascular examination reveals -normal heart sounds, regular rate and rhythm with no murmurs and normal pedal pulses bilaterally.  Abdomen Inspection Inspection of the abdomen reveals - No Hernias. Skin - Scar - no surgical scars. Palpation/Percussion Palpation and Percussion of the abdomen reveal - Soft, Non Tender, No Rebound tenderness, No Rigidity (guarding) and No hepatosplenomegaly. Auscultation Auscultation of the abdomen reveals - Bowel sounds normal.  Peripheral Vascular Upper Extremity Palpation - Pulses bilaterally normal.  Neurologic Neurologic evaluation reveals -alert and oriented x 3 with no impairment of recent or remote memory. Mental Status-Normal.  Neuropsychiatric The patient's mood and affect are described as -normal. Judgment and Insight-insight is appropriate concerning matters relevant to self.  Musculoskeletal Normal Exam - Left-Upper Extremity Strength Normal and Lower Extremity Strength Normal. Normal Exam - Right-Upper Extremity Strength Normal and Lower Extremity Strength Normal.  Lymphatic Head & Neck  General Head & Neck Lymphatics: Bilateral - Description - Normal. Axillary - Did not examine. Femoral & Inguinal - Did not examine.    Assessment & Plan Randall Hiss M. Mechelle Pates MD; 12/04/2018 11:03 AM)  SYMPTOMATIC CHOLELITHIASIS (K80.20) Impression: I believe the patient's symptoms are consistent with gallbladder disease.  We discussed gallbladder disease. The patient was given Neurosurgeon. We discussed non-operative and operative management. We discussed the signs & symptoms of  acute cholecystitis  I discussed laparoscopic cholecystectomy with IOC in detail. The patient was given educational material as well as diagrams detailing the procedure. We discussed the risks and benefits of a laparoscopic cholecystectomy including, but not limited to bleeding, infection, injury to surrounding structures such as the intestine or liver, bile leak, retained gallstones, need to convert to an open procedure, prolonged diarrhea, blood clots such as DVT, common bile duct injury, anesthesia risks, and possible need for additional procedures. We  discussed the typical post-operative recovery course. I explained that the likelihood of improvement of their symptoms is good.  The patient has elected to proceed with surgery.  We also discussed operating during the coronavirus pandemic. We discussed that she be tested for Covid preoperatively. We discussed the importance of social isolation between her tests and surgery  Current Plans Pt Education - Pamphlet Given - Laparoscopic Gallbladder Surgery: discussed with patient and provided information. You are being scheduled for surgery- Our schedulers will call you.  You should hear from our office's scheduling department within 5 working days about the location, date, and time of surgery. We try to make accommodations for patient's preferences in scheduling surgery, but sometimes the OR schedule or the surgeon's schedule prevents us from making those accommodations.  If you have not heard from our office [phone]) in 5 working days, call the office and ask for your surgeon's nurse.  If you have other questions about your diagnosis, plan, or surgery, call the office and ask for your surgeon's nurse.  Mary SellaEric M. Andrey CampanileWilson, MD, FACS General, Bariatric, & Minimally Invasive Surgery [facility], [place]

## 2018-12-24 NOTE — Progress Notes (Signed)
PCP - Dr. Emeterio Reeve Cardiologist -   Chest x-ray -  EKG -  Stress Test -  ECHO -  Cardiac Cath -   Sleep Study -  CPAP -   Fasting Blood Sugar -  Checks Blood Sugar _____ times a day  Blood Thinner Instructions: Aspirin Instructions: Last Dose:  Anesthesia review:   Patient denies shortness of breath, fever, cough and chest pain at PAT appointment   Patient verbalized understanding of instructions that were given to them at the PAT appointment. Patient was also instructed that they will need to review over the PAT instructions again at home before surgery.

## 2018-12-24 NOTE — Patient Instructions (Addendum)
YOU NEED TO HAVE A COVID 19 TEST ON 12-30-18  @ 3:05 AM, THIS TEST MUST BE DONE BEFORE SURGERY, COME  [address], [place] , [phone]. ONCE YOUR COVID TEST IS COMPLETED, PLEASE BEGIN THE QUARANTINE INSTRUCTIONS AS OUTLINED IN YOUR HANDOUT.                Megan Roman  12/24/2018   Your procedure is scheduled on: 01-02-19    [facility]    Report to Short Stay at 5:30 AM   1 VISITOR IS ALLOWED TO WAIT IN WAITING ROOM  ONLY DAY OF YOUR SURGERY. NO VISITORS ARE ALLOWED IN SHORT STAY OR RECOVERY ROOM.   Call this number if you have problems the morning of surgery [phone]    Remember: NO SOLID FOOD AFTER MIDNIGHT THE NIGHT PRIOR TO SURGERY. NOTHING BY MOUTH EXCEPT CLEAR LIQUIDS. PLEASE FINISH ENSURE DRINK PER SURGEON ORDER  WHICH NEEDS TO BE COMPLETED AT 4:30 AM .   CLEAR LIQUID DIET   Foods Allowed                                                                     Foods Excluded  Coffee and tea, regular and decaf                             liquids that you cannot  Plain Jell-O any favor except red or purple                                           see through such as: Fruit ices (not with fruit pulp)                                     milk, soups, orange juice  Iced Popsicles                                    All solid food Carbonated beverages, regular and diet                                    Cranberry, grape and apple juices Sports drinks like Gatorade Lightly seasoned clear broth or consume(fat free) Sugar, honey syrup   _____________________________________________________________________      Take these medicines the morning of surgery with A SIP OF WATER: None  BRUSH YOUR TEETH MORNING OF SURGERY AND RINSE YOUR MOUTH OUT, NO CHEWING GUM CANDY OR MINTS.                                You may not have any metal on your body including hair pins and              piercings     Do not wear jewelry, make-up,  lotions, powders or perfumes, deodorant                    Men may shave face and neck.   Do not bring valuables to the hospital. [place].  Contacts, dentures or bridgework may not be worn into surgery.     Patients discharged the day of surgery will not be allowed to drive home. IF YOU ARE HAVING SURGERY AND GOING HOME THE SAME DAY, YOU MUST HAVE AN ADULT TO DRIVE YOU HOME AND BE WITH YOU FOR 24 HOURS. YOU MAY GO HOME BY TAXI OR UBER OR ORTHERWISE, BUT AN ADULT MUST ACCOMPANY YOU HOME AND STAY WITH YOU FOR 24 HOURS.  Name and phone number of your driver: Teodora Baumgarten [phone]  Special Instructions: N/A              Please read over the following fact sheets you were given: _____________________________________________________________________             [facility] - Preparing for Surgery Before surgery, you can play an important role.  Because skin is not sterile, your skin needs to be as free of germs as possible.  You can reduce the number of germs on your skin by washing with CHG (chlorahexidine gluconate) soap before surgery.  CHG is an antiseptic cleaner which kills germs and bonds with the skin to continue killing germs even after washing. Please DO NOT use if you have an allergy to CHG or antibacterial soaps.  If your skin becomes reddened/irritated stop using the CHG and inform your nurse when you arrive at Short Stay. Do not shave (including legs and underarms) for at least 48 hours prior to the first CHG shower.  You may shave your face/neck. Please follow these instructions carefully:  1.  Shower with CHG Soap the night before surgery and the  morning of Surgery.  2.  If you choose to wash your hair, wash your hair first as usual with your  normal  shampoo.  3.  After you shampoo, rinse your hair and body thoroughly to remove the  shampoo.                           4.  Use CHG as you would any other liquid soap.  You can  apply chg directly  to the skin and wash                       Gently with a scrungie or clean washcloth.  5.  Apply the CHG Soap to your body ONLY FROM THE NECK DOWN.   Do not use on face/ open                           Wound or open sores. Avoid contact with eyes, ears mouth and genitals (private parts).                       Wash face,  Genitals (private parts) with your normal soap.             6.  Wash thoroughly, paying special attention to the area where your surgery  will be performed.  7.  Thoroughly rinse your body with warm water from the neck down.  8.  DO NOT shower/wash with your normal soap after using and rinsing off  the CHG Soap.                9.  Pat yourself dry with a clean towel.            10.  Wear clean pajamas.            11.  Place clean sheets on your bed the night of your first shower and do not  sleep with pets. Day of Surgery : Do not apply any lotions/deodorants the morning of surgery.  Please wear clean clothes to the hospital/surgery center.  FAILURE TO FOLLOW THESE INSTRUCTIONS MAY RESULT IN THE CANCELLATION OF YOUR SURGERY PATIENT SIGNATURE_________________________________  NURSE SIGNATURE__________________________________  ________________________________________________________________________

## 2018-12-26 ENCOUNTER — Encounter (HOSPITAL_COMMUNITY)
Admission: RE | Admit: 2018-12-26 | Discharge: 2018-12-26 | Disposition: A | Discharge status: Home / Self Care | Payer: 59 | Source: Ambulatory Visit | Attending: General Surgery | Admitting: General Surgery

## 2018-12-26 ENCOUNTER — Other Ambulatory Visit: Payer: Self-pay

## 2018-12-26 ENCOUNTER — Encounter (HOSPITAL_COMMUNITY): Payer: Self-pay

## 2018-12-26 DIAGNOSIS — K8020 Calculus of gallbladder without cholecystitis without obstruction: Secondary | ICD-10-CM | POA: Insufficient documentation

## 2018-12-26 DIAGNOSIS — Z01812 Encounter for preprocedural laboratory examination: Secondary | ICD-10-CM | POA: Diagnosis not present

## 2018-12-26 HISTORY — DX: Nausea with vomiting, unspecified: R11.2

## 2018-12-26 HISTORY — DX: Other complications of anesthesia, initial encounter: T88.59XA

## 2018-12-26 HISTORY — DX: Nausea with vomiting, unspecified: Z98.890

## 2018-12-26 LAB — CBC
HCT: 41 % (ref 36.0–46.0)
Hemoglobin: 13.3 g/dL (ref 12.0–15.0)
MCH: 33.4 pg (ref 26.0–34.0)
MCHC: 32.4 g/dL (ref 30.0–36.0)
MCV: 103 fL — ABNORMAL HIGH (ref 80.0–100.0)
Platelets: 225 10*3/uL (ref 150–400)
RBC: 3.98 MIL/uL (ref 3.87–5.11)
RDW: 12.2 % (ref 11.5–15.5)
WBC: 8.2 10*3/uL (ref 4.0–10.5)
nRBC: 0 % (ref 0.0–0.2)

## 2018-12-26 MED ORDER — ENSURE PRE-SURGERY PO LIQD
296.0000 mL | Freq: Once | ORAL | Status: DC
  Filled 2018-12-26: qty 296

## 2018-12-27 DIAGNOSIS — K8020 Calculus of gallbladder without cholecystitis without obstruction: Secondary | ICD-10-CM | POA: Diagnosis not present

## 2018-12-27 DIAGNOSIS — Z01812 Encounter for preprocedural laboratory examination: Secondary | ICD-10-CM | POA: Diagnosis not present

## 2018-12-30 ENCOUNTER — Other Ambulatory Visit (HOSPITAL_COMMUNITY)
Admission: RE | Admit: 2018-12-30 | Discharge: 2018-12-30 | Disposition: A | Discharge status: Home / Self Care | Payer: 59 | Source: Ambulatory Visit | Attending: General Surgery | Admitting: General Surgery

## 2018-12-30 DIAGNOSIS — Z01812 Encounter for preprocedural laboratory examination: Secondary | ICD-10-CM | POA: Diagnosis not present

## 2018-12-30 DIAGNOSIS — Z20828 Contact with and (suspected) exposure to other viral communicable diseases: Secondary | ICD-10-CM | POA: Insufficient documentation

## 2018-12-31 LAB — SARS CORONAVIRUS 2 (TAT 6-24 HRS): SARS Coronavirus 2: NEGATIVE

## 2019-01-01 NOTE — Anesthesia Preprocedure Evaluation (Addendum)
Anesthesia Evaluation  Patient identified by MRN, date of birth, ID band Patient awake    Reviewed: Allergy & Precautions, NPO status , Patient's Chart, lab work & pertinent test results  History of Anesthesia Complications (+) PONV  Airway Mallampati: I  TM Distance: >3 FB Neck ROM: Full    Dental  (+) Dental Advisory Given   Pulmonary  12/30/2018 SARS coronavirus NEG   breath sounds clear to auscultation       Cardiovascular negative cardio ROS   Rhythm:Regular Rate:Normal     Neuro/Psych Anxiety Depression negative neurological ROS     GI/Hepatic Neg liver ROS, Cholelithiasis   Endo/Other  negative endocrine ROS  Renal/GU negative Renal ROS     Musculoskeletal   Abdominal   Peds  Hematology negative hematology ROS (+)   Anesthesia Other Findings   Reproductive/Obstetrics S/p uterine ablation and BTL                            Anesthesia Physical Anesthesia Plan  ASA: I  Anesthesia Plan: General   Post-op Pain Management:    Induction: Intravenous  PONV Risk Score and Plan: 4 or greater and Ondansetron, Dexamethasone and Scopolamine patch - Pre-op  Airway Management Planned: Oral ETT  Additional Equipment:   Intra-op Plan:   Post-operative Plan: Extubation in OR  Informed Consent: I have reviewed the patients History and Physical, chart, labs and discussed the procedure including the risks, benefits and alternatives for the proposed anesthesia with the patient or authorized representative who has indicated his/her understanding and acceptance.     Dental advisory given  Plan Discussed with: CRNA and Surgeon  Anesthesia Plan Comments:         Anesthesia Quick Evaluation

## 2019-01-02 ENCOUNTER — Other Ambulatory Visit: Payer: Self-pay

## 2019-01-02 ENCOUNTER — Encounter (HOSPITAL_COMMUNITY): Payer: Self-pay | Admitting: *Deleted

## 2019-01-02 ENCOUNTER — Ambulatory Visit (HOSPITAL_COMMUNITY): Payer: 59 | Admitting: Physician Assistant

## 2019-01-02 ENCOUNTER — Encounter (HOSPITAL_COMMUNITY)
Admission: RE | Disposition: A | Discharge status: Home / Self Care | Payer: Self-pay | Source: Home / Self Care | Attending: General Surgery

## 2019-01-02 ENCOUNTER — Ambulatory Visit (HOSPITAL_COMMUNITY)
Admission: RE | Admit: 2019-01-02 | Discharge: 2019-01-02 | Disposition: A | Discharge status: Home / Self Care | Payer: 59 | Attending: General Surgery | Admitting: General Surgery

## 2019-01-02 ENCOUNTER — Ambulatory Visit (HOSPITAL_COMMUNITY): Payer: 59 | Admitting: Anesthesiology

## 2019-01-02 DIAGNOSIS — F329 Major depressive disorder, single episode, unspecified: Secondary | ICD-10-CM | POA: Insufficient documentation

## 2019-01-02 DIAGNOSIS — F418 Other specified anxiety disorders: Secondary | ICD-10-CM | POA: Diagnosis not present

## 2019-01-02 DIAGNOSIS — K8010 Calculus of gallbladder with chronic cholecystitis without obstruction: Secondary | ICD-10-CM | POA: Diagnosis not present

## 2019-01-02 DIAGNOSIS — F419 Anxiety disorder, unspecified: Secondary | ICD-10-CM | POA: Insufficient documentation

## 2019-01-02 DIAGNOSIS — J45909 Unspecified asthma, uncomplicated: Secondary | ICD-10-CM | POA: Diagnosis not present

## 2019-01-02 DIAGNOSIS — Z79899 Other long term (current) drug therapy: Secondary | ICD-10-CM | POA: Diagnosis not present

## 2019-01-02 DIAGNOSIS — K8020 Calculus of gallbladder without cholecystitis without obstruction: Secondary | ICD-10-CM | POA: Diagnosis not present

## 2019-01-02 HISTORY — PX: CHOLECYSTECTOMY: SHX55

## 2019-01-02 LAB — PREGNANCY, URINE: Preg Test, Ur: NEGATIVE

## 2019-01-02 SURGERY — LAPAROSCOPIC CHOLECYSTECTOMY
Anesthesia: General | Site: Abdomen

## 2019-01-02 MED ORDER — KETOROLAC TROMETHAMINE 30 MG/ML IJ SOLN
INTRAMUSCULAR | Status: AC
  Filled 2019-01-02: qty 1

## 2019-01-02 MED ORDER — BUPIVACAINE HCL (PF) 0.25 % IJ SOLN
INTRAMUSCULAR | Status: AC
  Filled 2019-01-02: qty 30

## 2019-01-02 MED ORDER — SUCCINYLCHOLINE CHLORIDE 200 MG/10ML IV SOSY
PREFILLED_SYRINGE | INTRAVENOUS | Status: DC | PRN
  Administered 2019-01-02: 100 mg via INTRAVENOUS

## 2019-01-02 MED ORDER — PROPOFOL 10 MG/ML IV BOLUS
INTRAVENOUS | Status: AC
  Filled 2019-01-02: qty 20

## 2019-01-02 MED ORDER — DEXAMETHASONE SODIUM PHOSPHATE 4 MG/ML IJ SOLN: 4.0000 mg | INTRAMUSCULAR | Status: DC

## 2019-01-02 MED ORDER — MIDAZOLAM HCL 2 MG/2ML IJ SOLN: 0.5000 mg | Freq: Once | INTRAMUSCULAR | Status: DC | PRN

## 2019-01-02 MED ORDER — SCOPOLAMINE 1 MG/3DAYS TD PT72
1.0000 | MEDICATED_PATCH | Freq: Once | TRANSDERMAL | Status: DC
  Administered 2019-01-02: 06:00:00 1.5 mg via TRANSDERMAL
  Filled 2019-01-02: qty 1

## 2019-01-02 MED ORDER — PHENYLEPHRINE 40 MCG/ML (10ML) SYRINGE FOR IV PUSH (FOR BLOOD PRESSURE SUPPORT)
PREFILLED_SYRINGE | INTRAVENOUS | Status: AC
  Filled 2019-01-02: qty 10

## 2019-01-02 MED ORDER — SUGAMMADEX SODIUM 200 MG/2ML IV SOLN
INTRAVENOUS | Status: DC | PRN
  Administered 2019-01-02: 200 mg via INTRAVENOUS

## 2019-01-02 MED ORDER — GABAPENTIN 300 MG PO CAPS
300.0000 mg | ORAL_CAPSULE | ORAL | Status: AC
  Administered 2019-01-02: 06:00:00 300 mg via ORAL
  Filled 2019-01-02: qty 1

## 2019-01-02 MED ORDER — CHLORHEXIDINE GLUCONATE CLOTH 2 % EX PADS: 6.0000 | MEDICATED_PAD | Freq: Once | CUTANEOUS | Status: DC

## 2019-01-02 MED ORDER — HYDROMORPHONE HCL 1 MG/ML IJ SOLN
INTRAMUSCULAR | Status: AC
  Administered 2019-01-02: 0.5 mg via INTRAVENOUS
  Filled 2019-01-02: qty 1

## 2019-01-02 MED ORDER — ONDANSETRON HCL 4 MG/2ML IJ SOLN
INTRAMUSCULAR | Status: AC
  Filled 2019-01-02: qty 2

## 2019-01-02 MED ORDER — KETOROLAC TROMETHAMINE 30 MG/ML IJ SOLN
INTRAMUSCULAR | Status: DC | PRN
  Administered 2019-01-02: 30 mg via INTRAVENOUS

## 2019-01-02 MED ORDER — MIDAZOLAM HCL 5 MG/5ML IJ SOLN
INTRAMUSCULAR | Status: DC | PRN
  Administered 2019-01-02: 2 mg via INTRAVENOUS

## 2019-01-02 MED ORDER — BUPIVACAINE HCL (PF) 0.25 % IJ SOLN
INTRAMUSCULAR | Status: DC | PRN
  Administered 2019-01-02: 30 mL

## 2019-01-02 MED ORDER — EPHEDRINE SULFATE-NACL 50-0.9 MG/10ML-% IV SOSY
PREFILLED_SYRINGE | INTRAVENOUS | Status: DC | PRN
  Administered 2019-01-02: 10 mg via INTRAVENOUS

## 2019-01-02 MED ORDER — FENTANYL CITRATE (PF) 250 MCG/5ML IJ SOLN
INTRAMUSCULAR | Status: AC
  Filled 2019-01-02: qty 5

## 2019-01-02 MED ORDER — MIDAZOLAM HCL 2 MG/2ML IJ SOLN
INTRAMUSCULAR | Status: AC
  Filled 2019-01-02: qty 2

## 2019-01-02 MED ORDER — LIDOCAINE 2% (20 MG/ML) 5 ML SYRINGE
INTRAMUSCULAR | Status: AC
  Filled 2019-01-02: qty 5

## 2019-01-02 MED ORDER — ONDANSETRON HCL 4 MG/2ML IJ SOLN
INTRAMUSCULAR | Status: DC | PRN
  Administered 2019-01-02: 4 mg via INTRAVENOUS

## 2019-01-02 MED ORDER — LIDOCAINE 2% (20 MG/ML) 5 ML SYRINGE
INTRAMUSCULAR | Status: DC | PRN
  Administered 2019-01-02: 60 mg via INTRAVENOUS

## 2019-01-02 MED ORDER — LACTATED RINGERS IR SOLN
Status: DC | PRN
  Administered 2019-01-02: 1000 mL

## 2019-01-02 MED ORDER — DEXAMETHASONE SODIUM PHOSPHATE 10 MG/ML IJ SOLN
INTRAMUSCULAR | Status: AC
  Filled 2019-01-02: qty 1

## 2019-01-02 MED ORDER — OXYCODONE HCL 5 MG PO TABS: 5.0000 mg | ORAL_TABLET | Freq: Four times a day (QID) | ORAL | 0 refills | Status: DC | PRN

## 2019-01-02 MED ORDER — PHENYLEPHRINE 40 MCG/ML (10ML) SYRINGE FOR IV PUSH (FOR BLOOD PRESSURE SUPPORT)
PREFILLED_SYRINGE | INTRAVENOUS | Status: DC | PRN
  Administered 2019-01-02 (×2): 40 ug via INTRAVENOUS

## 2019-01-02 MED ORDER — HYDROMORPHONE HCL 1 MG/ML IJ SOLN
0.2500 mg | INTRAMUSCULAR | Status: DC | PRN
  Administered 2019-01-02 (×2): 0.5 mg via INTRAVENOUS

## 2019-01-02 MED ORDER — DEXAMETHASONE SODIUM PHOSPHATE 10 MG/ML IJ SOLN
INTRAMUSCULAR | Status: DC | PRN
  Administered 2019-01-02: 10 mg via INTRAVENOUS

## 2019-01-02 MED ORDER — FENTANYL CITRATE (PF) 250 MCG/5ML IJ SOLN
INTRAMUSCULAR | Status: DC | PRN
  Administered 2019-01-02: 100 ug via INTRAVENOUS

## 2019-01-02 MED ORDER — MEPERIDINE HCL 50 MG/ML IJ SOLN: 6.2500 mg | INTRAMUSCULAR | Status: DC | PRN

## 2019-01-02 MED ORDER — ROCURONIUM BROMIDE 10 MG/ML (PF) SYRINGE
PREFILLED_SYRINGE | INTRAVENOUS | Status: DC | PRN
  Administered 2019-01-02: 40 mg via INTRAVENOUS
  Administered 2019-01-02: 10 mg via INTRAVENOUS

## 2019-01-02 MED ORDER — 0.9 % SODIUM CHLORIDE (POUR BTL) OPTIME
TOPICAL | Status: DC | PRN
  Administered 2019-01-02: 1000 mL

## 2019-01-02 MED ORDER — SODIUM CHLORIDE 0.9 % IV SOLN
2.0000 g | INTRAVENOUS | Status: AC
  Administered 2019-01-02: 08:00:00 2 g via INTRAVENOUS
  Filled 2019-01-02: qty 2

## 2019-01-02 MED ORDER — PROPOFOL 10 MG/ML IV BOLUS
INTRAVENOUS | Status: DC | PRN
  Administered 2019-01-02: 150 mg via INTRAVENOUS

## 2019-01-02 MED ORDER — EPHEDRINE 5 MG/ML INJ
INTRAVENOUS | Status: AC
  Filled 2019-01-02: qty 10

## 2019-01-02 MED ORDER — ACETAMINOPHEN 500 MG PO TABS
1000.0000 mg | ORAL_TABLET | ORAL | Status: AC
  Administered 2019-01-02: 06:00:00 1000 mg via ORAL
  Filled 2019-01-02: qty 2

## 2019-01-02 MED ORDER — PROMETHAZINE HCL 25 MG/ML IJ SOLN: 6.2500 mg | INTRAMUSCULAR | Status: DC | PRN

## 2019-01-02 MED ORDER — LACTATED RINGERS IV SOLN
INTRAVENOUS | Status: DC
  Administered 2019-01-02 (×2): via INTRAVENOUS

## 2019-01-02 MED ORDER — SCOPOLAMINE 1 MG/3DAYS TD PT72: 1.0000 | MEDICATED_PATCH | TRANSDERMAL | Status: DC

## 2019-01-02 MED FILL — oxyCODONE HCL 5 MG TABS: 5 | 3 days supply | Qty: 12 | Fill #0

## 2019-01-02 SURGICAL SUPPLY — 57 items
APPLICATOR ARISTA FLEXITIP XL (MISCELLANEOUS) IMPLANT
APPLIER CLIP 5 13 M/L LIGAMAX5 (MISCELLANEOUS) ×3
APPLIER CLIP ROT 10 11.4 M/L (STAPLE)
BENZOIN TINCTURE PRP APPL 2/3 (GAUZE/BANDAGES/DRESSINGS) ×3 IMPLANT
BNDG ADH 1X3 SHEER STRL LF (GAUZE/BANDAGES/DRESSINGS) ×9 IMPLANT
CABLE HIGH FREQUENCY MONO STRZ (ELECTRODE) IMPLANT
CHLORAPREP W/TINT 26 (MISCELLANEOUS) ×3 IMPLANT
CLIP APPLIE 5 13 M/L LIGAMAX5 (MISCELLANEOUS) ×1 IMPLANT
CLIP APPLIE ROT 10 11.4 M/L (STAPLE) IMPLANT
CLIP VESOLOCK MED LG 6/CT (CLIP) IMPLANT
CLOSURE WOUND 1/2 X4 (GAUZE/BANDAGES/DRESSINGS) ×1
COVER MAYO STAND STRL (DRAPES) IMPLANT
COVER SURGICAL LIGHT HANDLE (MISCELLANEOUS) ×3 IMPLANT
COVER WAND RF STERILE (DRAPES) IMPLANT
DECANTER SPIKE VIAL GLASS SM (MISCELLANEOUS) ×3 IMPLANT
DERMABOND ADVANCED (GAUZE/BANDAGES/DRESSINGS)
DERMABOND ADVANCED .7 DNX12 (GAUZE/BANDAGES/DRESSINGS) IMPLANT
DRAPE C-ARM 42X120 X-RAY (DRAPES) IMPLANT
DRSG TEGADERM 2-3/8X2-3/4 SM (GAUZE/BANDAGES/DRESSINGS) ×3 IMPLANT
ELECT PENCIL ROCKER SW 15FT (MISCELLANEOUS) ×3 IMPLANT
ELECT REM PT RETURN 15FT ADLT (MISCELLANEOUS) ×3 IMPLANT
GAUZE SPONGE 2X2 8PLY STRL LF (GAUZE/BANDAGES/DRESSINGS) ×1 IMPLANT
GLOVE BIO SURGEON STRL SZ7.5 (GLOVE) ×3 IMPLANT
GLOVE BIOGEL PI IND STRL 7.0 (GLOVE) ×2 IMPLANT
GLOVE BIOGEL PI IND STRL 7.5 (GLOVE) ×2 IMPLANT
GLOVE BIOGEL PI INDICATOR 7.0 (GLOVE) ×4
GLOVE BIOGEL PI INDICATOR 7.5 (GLOVE) ×4
GLOVE INDICATOR 8.0 STRL GRN (GLOVE) ×3 IMPLANT
GLOVE SURG SS PI 7.0 STRL IVOR (GLOVE) ×3 IMPLANT
GOWN STRL REUS W/TWL XL LVL3 (GOWN DISPOSABLE) ×9 IMPLANT
GRASPER SUT TROCAR 14GX15 (MISCELLANEOUS) IMPLANT
HEMOSTAT ARISTA ABSORB 3G PWDR (HEMOSTASIS) IMPLANT
HEMOSTAT SNOW SURGICEL 2X4 (HEMOSTASIS) IMPLANT
KIT BASIN OR (CUSTOM PROCEDURE TRAY) ×3 IMPLANT
KIT TURNOVER KIT A (KITS) IMPLANT
L-HOOK LAP DISP 36CM (ELECTROSURGICAL) ×3
LHOOK LAP DISP 36CM (ELECTROSURGICAL) ×1 IMPLANT
PENCIL SMOKE EVACUATOR (MISCELLANEOUS) IMPLANT
POUCH RETRIEVAL ECOSAC 10 (ENDOMECHANICALS) ×1 IMPLANT
POUCH RETRIEVAL ECOSAC 10MM (ENDOMECHANICALS) ×2
SCISSORS LAP 5X35 DISP (ENDOMECHANICALS) ×3 IMPLANT
SET CHOLANGIOGRAPH MIX (MISCELLANEOUS) IMPLANT
SET IRRIG TUBING LAPAROSCOPIC (IRRIGATION / IRRIGATOR) ×3 IMPLANT
SET TUBE SMOKE EVAC HIGH FLOW (TUBING) ×3 IMPLANT
SLEEVE XCEL OPT CAN 5 100 (ENDOMECHANICALS) ×6 IMPLANT
SPONGE GAUZE 2X2 STER 10/PKG (GAUZE/BANDAGES/DRESSINGS) ×2
STRIP CLOSURE SKIN 1/2X4 (GAUZE/BANDAGES/DRESSINGS) ×2 IMPLANT
SUT MNCRL AB 4-0 PS2 18 (SUTURE) ×6 IMPLANT
SUT VIC AB 0 UR5 27 (SUTURE) IMPLANT
SUT VICRYL 0 TIES 12 18 (SUTURE) IMPLANT
SUT VICRYL 0 UR6 27IN ABS (SUTURE) IMPLANT
TOWEL OR 17X26 10 PK STRL BLUE (TOWEL DISPOSABLE) ×3 IMPLANT
TOWEL OR NON WOVEN STRL DISP B (DISPOSABLE) ×3 IMPLANT
TRAY LAPAROSCOPIC (CUSTOM PROCEDURE TRAY) ×3 IMPLANT
TROCAR BLADELESS OPT 5 100 (ENDOMECHANICALS) ×3 IMPLANT
TROCAR XCEL BLUNT TIP 100MML (ENDOMECHANICALS) IMPLANT
TROCAR XCEL NON-BLD 11X100MML (ENDOMECHANICALS) IMPLANT

## 2019-01-02 NOTE — Anesthesia Procedure Notes (Signed)
Procedure Name: Intubation Date/Time: 01/02/2019 7:36 AM Performed by: Talbot Grumbling, CRNA Pre-anesthesia Checklist: Patient identified, Emergency Drugs available, Suction available and Patient being monitored Patient Re-evaluated:Patient Re-evaluated prior to induction Oxygen Delivery Method: Circle system utilized Preoxygenation: Pre-oxygenation with 100% oxygen Induction Type: IV induction Laryngoscope Size: Miller and 2 Grade View: Grade I Tube type: Oral Tube size: 7.0 mm Number of attempts: 1 Airway Equipment and Method: Stylet Placement Confirmation: ETT inserted through vocal cords under direct vision,  positive ETCO2 and breath sounds checked- equal and bilateral Secured at: 21 cm Tube secured with: Tape Dental Injury: Teeth and Oropharynx as per pre-operative assessment

## 2019-01-02 NOTE — Anesthesia Postprocedure Evaluation (Signed)
Anesthesia Post Note  Patient: Megan Roman  Procedure(s) Performed: LAPAROSCOPIC CHOLECYSTECTOMY (N/A Abdomen)     Patient location during evaluation: PACU Anesthesia Type: General Level of consciousness: awake and alert, oriented and patient cooperative Pain management: pain level controlled Vital Signs Assessment: post-procedure vital signs reviewed and stable Respiratory status: spontaneous breathing, nonlabored ventilation and respiratory function stable Cardiovascular status: blood pressure returned to baseline and stable Postop Assessment: no apparent nausea or vomiting, able to ambulate and adequate PO intake Anesthetic complications: no    Last Vitals:  Vitals:   01/02/19 0945 01/02/19 1018  BP: 108/75 109/74  Pulse: 79 80  Resp: 13 18  Temp: 36.6 C (!) 36.3 C  SpO2: 100% 100%    Last Pain:  Vitals:   01/02/19 1018  TempSrc: Axillary  PainSc: 4                  Kanan Sobek,E. Colten Desroches

## 2019-01-02 NOTE — Transfer of Care (Signed)
Immediate Anesthesia Transfer of Care Note  Patient: Megan Roman  Procedure(s) Performed: LAPAROSCOPIC CHOLECYSTECTOMY (N/A Abdomen)  Patient Location: PACU  Anesthesia Type:General  Level of Consciousness: sedated  Airway & Oxygen Therapy: Patient Spontanous Breathing and Patient connected to face mask oxygen  Post-op Assessment: Report given to RN and Post -op Vital signs reviewed and stable  Post vital signs: Reviewed and stable  Last Vitals:  Vitals Value Taken Time  BP    Temp    Pulse    Resp 21 01/02/19 0851  SpO2    Vitals shown include unvalidated device data.  Last Pain:  Vitals:   01/02/19 0558  TempSrc: Oral      Patients Stated Pain Goal: 3 (17/51/02 5852)  Complications: No apparent anesthesia complications

## 2019-01-02 NOTE — Op Note (Signed)
Megan Roman [id number] 20-Dec-1975 01/02/2019  Laparoscopic Cholecystectomy  Procedure Note  Indications: This patient presents with symptomatic gallbladder disease and will undergo laparoscopic cholecystectomy.  She had normal preoperative LFTs.  No evidence of common bile duct dilation on preoperative imaging.  Pre-operative Diagnosis: Symptomatic cholelithiasis  Post-operative Diagnosis: Same  Surgeon: Greer Pickerel MD FACS  Assistants: Carlena Hurl PA-C  Anesthesia: General endotracheal anesthesia  Procedure Details  The patient was seen again in the Holding Room. The risks, benefits, complications, treatment options, and expected outcomes were discussed with the patient. The possibilities of reaction to medication, pulmonary aspiration, perforation of viscus, bleeding, recurrent infection, finding a normal gallbladder, the need for additional procedures, failure to diagnose a condition, the possible need to convert to an open procedure, and creating a complication requiring transfusion or operation were discussed with the patient. The likelihood of improving the patient's symptoms with return to their baseline status is good.  The patient and/or family concurred with the proposed plan, giving informed consent. The site of surgery properly noted. The patient was taken to Operating Room, identified as [facility] and the procedure verified as Laparoscopic Cholecystectomy with possible Intraoperative Cholangiogram. A Time Out was held and the above information confirmed. Antibiotic prophylaxis was administered.   Prior to the induction of general anesthesia, antibiotic prophylaxis was administered. General endotracheal anesthesia was then administered and tolerated well. After the induction, the abdomen was prepped with Chloraprep and draped in the sterile fashion. The patient was positioned in the supine position.  Local anesthetic agent was injected into the skin near the umbilicus and  an incision made. We dissected down to the abdominal fascia with blunt dissection.  The fascia was incised vertically and we entered the peritoneal cavity bluntly.  A pursestring suture of 0-Vicryl was placed around the fascial opening.  The Hasson cannula was inserted and secured with the stay suture.  Pneumoperitoneum was then created with CO2 and tolerated well without any adverse changes in the patient's vital signs. An 5-mm port was placed in the subxiphoid position.  Two 5-mm ports were placed in the right upper quadrant. All skin incisions were infiltrated with a local anesthetic agent before making the incision and placing the trocars.   We positioned the patient in reverse Trendelenburg, tilted slightly to the patient's left.  The gallbladder was identified, the fundus grasped and retracted cephalad. Adhesions were lysed bluntly and with the electrocautery where indicated, taking care not to injure any adjacent organs or viscus. The infundibulum was grasped and retracted laterally, exposing the peritoneum overlying the triangle of Calot. This was then divided and exposed in a blunt fashion. A critical view of the cystic duct and cystic artery was obtained.  The cystic duct was clearly identified and bluntly dissected circumferentially. The cystic duct was ligated with a clip distally.     The cystic duct was then ligated with clips and divided. The cystic artery which had been identified & dissected free was ligated with clips and divided as well.   The gallbladder was dissected from the liver bed in retrograde fashion with the electrocautery.  There was a little bit of spillage of bile from the gallbladder as it was being mobilized.  But there is no spillage of gallstones.  The gallbladder was completely mobilized from the gallbladder fossa.  There is a small half millimeter attachment just at the very end of the gallbladder where it was still adhered to the right edge of the liver.  It was  unsure if  this was a duct of luska so I did place a clip on it after it did not transect easily with cautery.  the gallbladder was removed and placed in an Ecco sac.  The gallbladder and Ecco sac were then removed through the umbilical port site. The liver bed was irrigated and inspected. Hemostasis was achieved with the electrocautery. Copious irrigation was utilized and was repeatedly aspirated until clear.  The pursestring suture was used to close the umbilical fascia.  There was a little bit of omentum which was adhered to the umbilical area but this was left alone.  We again inspected the right upper quadrant for hemostasis.  The umbilical closure was inspected and there was no air leak and nothing trapped within the closure. Pneumoperitoneum was released as we removed the trocars.  4-0 Monocryl was used to close the skin.   Benzoin, steri-strips, and clean dressings were applied. The patient was then extubated and brought to the recovery room in stable condition. Instrument, sponge, and needle counts were correct at closure and at the conclusion of the case.   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         Disposition: PACU - hemodynamically stable.         Condition: stable  Mary SellaEric M. Andrey CampanileWilson, MD, FACS General, Bariatric, & Minimally Invasive Surgery [facility] [place]

## 2019-01-02 NOTE — Interval H&P Note (Signed)
History and Physical Interval Note:  01/02/2019 7:22 AM  Megan Roman  has presented today for surgery, with the diagnosis of SYMPTOMATIC CHOLELITHIASIS.  The various methods of treatment have been discussed with the patient and family. After consideration of risks, benefits and other options for treatment, the patient has consented to  Procedure(s): LAPAROSCOPIC CHOLECYSTECTOMY WITH POSSIBLE INTRAOPERATIVE CHOLANGIOGRAM (N/A) as a surgical intervention.  The patient's history has been reviewed, patient examined, no change in status, stable for surgery.  I have reviewed the patient's chart and labs.  Questions were answered to the patient's satisfaction.    Leighton Ruff. Redmond Pulling, MD, FACS General, Bariatric, & Minimally Invasive Surgery [facility] [place]   Greer Pickerel

## 2019-01-02 NOTE — Discharge Instructions (Signed)
[facility] °LAPAROSCOPIC SURGERY: POST OP INSTRUCTIONS °Always review your discharge instruction sheet given to you by the facility where your surgery was performed. °IF YOU HAVE DISABILITY OR FAMILY LEAVE FORMS, YOU MUST BRING THEM TO THE OFFICE FOR PROCESSING.   °DO NOT GIVE THEM TO YOUR DOCTOR. ° °PAIN CONTROL ° °1. First take acetaminophen (Tylenol) AND/or ibuprofen (Advil) to control your pain after surgery.  Follow directions on package.  Taking acetaminophen (Tylenol) and/or ibuprofen (Advil) regularly after surgery will help to control your pain and lower the amount of prescription pain medication you may need.  You should not take more than 3,000 mg (3 grams) of acetaminophen (Tylenol) in 24 hours.  You should not take ibuprofen (Advil), aleve, motrin, naprosyn or other NSAIDS if you have a history of stomach ulcers or chronic kidney disease.  °2. A prescription for pain medication may be given to you upon discharge.  Take your pain medication as prescribed, if you still have uncontrolled pain after taking acetaminophen (Tylenol) or ibuprofen (Advil). °3. Use ice packs to help control pain. °4. If you need a refill on your pain medication, please contact your pharmacy.  They will contact our office to request authorization. Prescriptions will not be filled after 5pm or on week-ends. ° °HOME MEDICATIONS °5. Take your usually prescribed medications unless otherwise directed. ° °DIET °6. You should follow a light diet the first few days after arrival home.  Be sure to include lots of fluids daily. Avoid fatty, fried foods.  ° °CONSTIPATION °7. It is common to experience some constipation after surgery and if you are taking pain medication.  Increasing fluid intake and taking a stool softener (such as Colace) will usually help or prevent this problem from occurring.  A mild laxative (Milk of Magnesia or Miralax) should be taken according to package instructions if there are no bowel  movements after 48 hours. ° °WOUND/INCISION CARE °8. Most patients will experience some swelling and bruising in the area of the incisions.  Ice packs will help.  Swelling and bruising can take several days to resolve.  °9. Unless discharge instructions indicate otherwise, follow guidelines below  °a. STERI-STRIPS - you may remove your outer bandages 48 hours after surgery, and you may shower at that time.  You have steri-strips (small skin tapes) in place directly over the incision.  These strips should be left on the skin for 7-10 days.   °b. DERMABOND/SKIN GLUE - you may shower in 24 hours.  The glue will flake off over the next 2-3 weeks. °10. Any sutures or staples will be removed at the office during your follow-up visit. ° °ACTIVITIES °11. You may resume regular (light) daily activities beginning the next day--such as daily self-care, walking, climbing stairs--gradually increasing activities as tolerated.  You may have sexual intercourse when it is comfortable.  Refrain from any heavy lifting or straining until approved by your doctor. °a. You may drive when you are no longer taking prescription pain medication, you can comfortably wear a seatbelt, and you can safely maneuver your car and apply brakes. ° °FOLLOW-UP °12. You should see your doctor in the office for a follow-up appointment approximately 2-3 weeks after your surgery.  You should have been given your post-op/follow-up appointment when your surgery was scheduled.  If you did not receive a post-op/follow-up appointment, make sure that you call for this appointment within a day or two after you arrive home to insure a convenient appointment time. ° °OTHER   INSTRUCTIONS °13.  ° °WHEN TO CALL YOUR DOCTOR: °1. Fever over 101.0 °2. Inability to urinate °3. Continued bleeding from incision. °4. Increased pain, redness, or drainage from the incision. °5. Increasing abdominal pain ° °The clinic staff is available to answer your questions during regular  business hours.  Please don’t hesitate to call and ask to speak to one of the nurses for clinical concerns.  If you have a medical emergency, go to the nearest emergency room or call 911.  A surgeon from [facility] is always on call at the hospital. °[address], [address], [place], [place]  [address] ? [address] [address], [place], [place]   [address] °[phone] ? [phone] ? FAX [phone] °Web site: [email or web address] ° °••••••••• ° ° °Managing Your Pain After Surgery Without Opioids ° ° ° °Thank you for participating in our program to help patients manage their pain after surgery without opioids. This is part of our effort to provide you with the best care possible, without exposing you or your family to the risk that opioids pose. ° °What pain can I expect after surgery? °You can expect to have some pain after surgery. This is normal. The pain is typically worse the day after surgery, and quickly begins to get better. °Many studies have found that many patients are able to manage their pain after surgery with Over-the-Counter (OTC) medications such as Tylenol and Motrin. If you have a condition that does not allow you to take Tylenol or Motrin, notify your surgical team. ° °How will I manage my pain? °The best strategy for controlling your pain after surgery is around the clock pain control with Tylenol (acetaminophen) and Motrin (ibuprofen or Advil). Alternating these medications with each other allows you to maximize your pain control. In addition to Tylenol and Motrin, you can use heating pads or ice packs on your incisions to help reduce your pain. ° °How will I alternate your regular strength over-the-counter pain medication? °You will take a dose of pain medication every three hours. °; Start by taking 650 mg of Tylenol (2 pills of 325 mg) °; 3 hours later take 600 mg of Motrin (3 pills of 200 mg) °; 3 hours after taking the Motrin take 650 mg of Tylenol °; 3 hours  after that take 600 mg of Motrin. ° ° °- 1 - ° °See example - if your first dose of Tylenol is at 12:00 PM ° ° °12:00 PM Tylenol 650 mg (2 pills of 325 mg)  °3:00 PM Motrin 600 mg (3 pills of 200 mg)  °6:00 PM Tylenol 650 mg (2 pills of 325 mg)  °9:00 PM Motrin 600 mg (3 pills of 200 mg)  °Continue alternating every 3 hours  ° °We recommend that you follow this schedule around-the-clock for at least 3 days after surgery, or until you feel that it is no longer needed. Use the table on the last page of this handout to keep track of the medications you are taking. °Important: °Do not take more than 3000mg of Tylenol or 2300mg of Motrin in a 24-hour period. °Do not take ibuprofen/Motrin if you have a history of bleeding stomach ulcers, severe kidney disease, &/or actively taking a blood thinner ° °What if I still have pain? °If you have pain that is not controlled with the over-the-counter pain medications (Tylenol and Motrin or Advil) you might have what we call “breakthrough” pain. You will receive a prescription for a small amount of an opioid   pain medication such as Oxycodone, Tramadol, or Tylenol with Codeine. Use these opioid pills in the first 24 hours after surgery if you have breakthrough pain. Do not take more than 1 pill every 4-6 hours. ° °If you still have uncontrolled pain after using all opioid pills, don't hesitate to call our staff using the number provided. We will help make sure you are managing your pain in the best way possible, and if necessary, we can provide a prescription for additional pain medication. ° ° °Day 1   ° °Time  °Name of Medication Number of pills taken  °Amount of Acetaminophen  °Pain Level  ° °Comments  °AM PM       °AM PM       °AM PM       °AM PM       °AM PM       °AM PM       °AM PM       °AM PM       °Total Daily amount of Acetaminophen °Do not take more than  3,000 mg per day    ° ° °Day 2   ° °Time  °Name of Medication Number of pills °taken  °Amount of Acetaminophen  °Pain  Level  ° °Comments  °AM PM       °AM PM       °AM PM       °AM PM       °AM PM       °AM PM       °AM PM       °AM PM       °Total Daily amount of Acetaminophen °Do not take more than  3,000 mg per day    ° ° °Day 3   ° °Time  °Name of Medication Number of pills taken  °Amount of Acetaminophen  °Pain Level  ° °Comments  °AM PM       °AM PM       °AM PM       °AM PM       ° ° ° °AM PM       °AM PM       °AM PM       °AM PM       °Total Daily amount of Acetaminophen °Do not take more than  3,000 mg per day    ° ° °Day 4   ° °Time  °Name of Medication Number of pills taken  °Amount of Acetaminophen  °Pain Level  ° °Comments  °AM PM       °AM PM       °AM PM       °AM PM       °AM PM       °AM PM       °AM PM       °AM PM       °Total Daily amount of Acetaminophen °Do not take more than  3,000 mg per day    ° ° °Day 5   ° °Time  °Name of Medication Number °of pills taken  °Amount of Acetaminophen  °Pain Level  ° °Comments  °AM PM       °AM PM       °AM PM       °AM PM       °AM PM       °AM PM       °  AM PM       AM PM       Total Daily amount of Acetaminophen Do not take more than  3,000 mg per day       Day 6    Time  Name of Medication Number of pills taken  Amount of Acetaminophen  Pain Level  Comments  AM PM       AM PM       AM PM       AM PM       AM PM       AM PM       AM PM       AM PM       Total Daily amount of Acetaminophen Do not take more than  3,000 mg per day      Day 7    Time  Name of Medication Number of pills taken  Amount of Acetaminophen  Pain Level   Comments  AM PM       AM PM       AM PM       AM PM       AM PM       AM PM       AM PM       AM PM       Total Daily amount of Acetaminophen Do not take more than  3,000 mg per day        For additional information about how and where to safely dispose of unused opioid medications - [employer]  Disclaimer: This document contains information and/or instructional materials adapted  from [place] for the typical patient with your condition. It does not replace medical advice from your health care provider because your experience may differ from that of the typical patient. Talk to your health care provider if you have any questions about this document, your condition or your treatment plan. Adapted from [facility], Adult, Care After This sheet gives you information about how to care for yourself after your procedure. Your health care provider may also give you more specific instructions. If you have problems or questions, contact your health care provider. What can I expect after the procedure? After the procedure, the following side effects are common:  Pain or discomfort at the IV site.  Nausea.  Vomiting.  Sore throat.  Trouble concentrating.  Feeling cold or chills.  Weak or tired.  Sleepiness and fatigue.  Soreness and body aches. These side effects can affect parts of the body that were not involved in surgery. Follow these instructions at home:  For at least 24 hours after the procedure:  Have a responsible adult stay with you. It is important to have someone help care for you until you are awake and alert.  Rest as needed.  Do not: ? Participate in activities in which you could fall or become injured. ? Drive. ? Use heavy machinery. ? Drink alcohol. ? Take sleeping pills or medicines that cause drowsiness. ? Make important decisions or sign legal documents. ? Take care of children on your own. Eating and drinking  Follow any instructions from your health care provider about eating or drinking restrictions.  When you feel hungry, start by eating small amounts of foods that are soft and easy to digest (bland), such as toast. Gradually return to your regular diet.  Drink enough fluid to keep your urine pale yellow.  If you vomit,  rehydrate by drinking water, juice, or clear broth. General  instructions  If you have sleep apnea, surgery and certain medicines can increase your risk for breathing problems. Follow instructions from your health care provider about wearing your sleep device: ? Anytime you are sleeping, including during daytime naps. ? While taking prescription pain medicines, sleeping medicines, or medicines that make you drowsy.  Return to your normal activities as told by your health care provider. Ask your health care provider what activities are safe for you.  Take over-the-counter and prescription medicines only as told by your health care provider.  If you smoke, do not smoke without supervision.  Keep all follow-up visits as told by your health care provider. This is important. Contact a health care provider if:  You have nausea or vomiting that does not get better with medicine.  You cannot eat or drink without vomiting.  You have pain that does not get better with medicine.  You are unable to pass urine.  You develop a skin rash.  You have a fever.  You have redness around your IV site that gets worse. Get help right away if:  You have difficulty breathing.  You have chest pain.  You have blood in your urine or stool, or you vomit blood. Summary  After the procedure, it is common to have a sore throat or nausea. It is also common to feel tired.  Have a responsible adult stay with you for the first 24 hours after general anesthesia. It is important to have someone help care for you until you are awake and alert.  When you feel hungry, start by eating small amounts of foods that are soft and easy to digest (bland), such as toast. Gradually return to your regular diet.  Drink enough fluid to keep your urine pale yellow.  Return to your normal activities as told by your health care provider. Ask your health care provider what activities are safe for you. This information is not intended to replace advice given to you by your health care  provider. Make sure you discuss any questions you have with your health care provider. Document Released: 07/24/2000 Document Revised: 04/20/2017 Document Reviewed: 12/01/2016 Elsevier Patient Education  2020 [employer].

## 2019-01-03 ENCOUNTER — Encounter (HOSPITAL_COMMUNITY): Payer: Self-pay | Admitting: General Surgery

## 2019-02-24 DIAGNOSIS — F902 Attention-deficit hyperactivity disorder, combined type: Secondary | ICD-10-CM | POA: Diagnosis not present

## 2019-02-24 DIAGNOSIS — Z79899 Other long term (current) drug therapy: Secondary | ICD-10-CM | POA: Diagnosis not present

## 2019-02-24 DIAGNOSIS — F419 Anxiety disorder, unspecified: Secondary | ICD-10-CM | POA: Diagnosis not present

## 2019-02-24 MED FILL — buPROPion HCL ER (XL) 150 M: 150 | 90 days supply | Qty: 180 | Fill #1

## 2019-02-24 MED FILL — VYVANSE 60 MG CHEW: 60 | 90 days supply | Qty: 90 | Fill #0

## 2019-03-17 ENCOUNTER — Encounter (INDEPENDENT_AMBULATORY_CARE_PROVIDER_SITE_OTHER): Payer: 59 | Admitting: Osteopathic Medicine

## 2019-03-17 DIAGNOSIS — R918 Other nonspecific abnormal finding of lung field: Secondary | ICD-10-CM

## 2019-03-18 ENCOUNTER — Other Ambulatory Visit: Payer: Self-pay | Admitting: Osteopathic Medicine

## 2019-03-18 DIAGNOSIS — Z1231 Encounter for screening mammogram for malignant neoplasm of breast: Secondary | ICD-10-CM

## 2019-03-18 NOTE — Telephone Encounter (Signed)
5 mins spent  

## 2019-03-26 ENCOUNTER — Ambulatory Visit (INDEPENDENT_AMBULATORY_CARE_PROVIDER_SITE_OTHER): Payer: 59

## 2019-03-26 ENCOUNTER — Other Ambulatory Visit: Payer: Self-pay

## 2019-03-26 DIAGNOSIS — R911 Solitary pulmonary nodule: Secondary | ICD-10-CM | POA: Diagnosis not present

## 2019-03-26 DIAGNOSIS — R918 Other nonspecific abnormal finding of lung field: Secondary | ICD-10-CM | POA: Diagnosis not present

## 2019-03-26 DIAGNOSIS — Z1231 Encounter for screening mammogram for malignant neoplasm of breast: Secondary | ICD-10-CM | POA: Diagnosis not present

## 2019-03-31 ENCOUNTER — Encounter

## 2019-04-28 ENCOUNTER — Encounter: Payer: Self-pay | Admitting: Osteopathic Medicine

## 2019-05-28 DIAGNOSIS — F338 Other recurrent depressive disorders: Secondary | ICD-10-CM | POA: Diagnosis not present

## 2019-05-28 DIAGNOSIS — F419 Anxiety disorder, unspecified: Secondary | ICD-10-CM | POA: Diagnosis not present

## 2019-05-28 DIAGNOSIS — F902 Attention-deficit hyperactivity disorder, combined type: Secondary | ICD-10-CM | POA: Diagnosis not present

## 2019-05-28 DIAGNOSIS — Z79899 Other long term (current) drug therapy: Secondary | ICD-10-CM | POA: Diagnosis not present

## 2019-05-28 MED FILL — VYVANSE 60 MG CHEW: 60 | 90 days supply | Qty: 90 | Fill #0

## 2019-05-30 DIAGNOSIS — H52223 Regular astigmatism, bilateral: Secondary | ICD-10-CM | POA: Diagnosis not present

## 2019-05-30 DIAGNOSIS — H5203 Hypermetropia, bilateral: Secondary | ICD-10-CM | POA: Diagnosis not present

## 2019-05-30 DIAGNOSIS — H524 Presbyopia: Secondary | ICD-10-CM | POA: Diagnosis not present

## 2019-05-30 DIAGNOSIS — Z135 Encounter for screening for eye and ear disorders: Secondary | ICD-10-CM | POA: Diagnosis not present

## 2019-08-19 DIAGNOSIS — N951 Menopausal and female climacteric states: Secondary | ICD-10-CM | POA: Diagnosis not present

## 2019-08-19 LAB — THYROID PANEL WITH TSH
Anti-TPO Ab (RDL): 1
Free T3: 3.2
Free T4: 1.13
TSH: 1.483

## 2019-08-19 LAB — PROGESTERONE: Progesterone: 0.4

## 2019-08-19 LAB — TESTOSTERONE: Testosterone: 31

## 2019-08-19 LAB — ESTRADIOL: Estradiol: 111.2

## 2019-08-20 DIAGNOSIS — F419 Anxiety disorder, unspecified: Secondary | ICD-10-CM | POA: Diagnosis not present

## 2019-08-20 DIAGNOSIS — Z79899 Other long term (current) drug therapy: Secondary | ICD-10-CM | POA: Diagnosis not present

## 2019-08-20 DIAGNOSIS — F902 Attention-deficit hyperactivity disorder, combined type: Secondary | ICD-10-CM | POA: Diagnosis not present

## 2019-08-22 DIAGNOSIS — Z79899 Other long term (current) drug therapy: Secondary | ICD-10-CM | POA: Diagnosis not present

## 2019-08-22 DIAGNOSIS — F902 Attention-deficit hyperactivity disorder, combined type: Secondary | ICD-10-CM | POA: Diagnosis not present

## 2019-08-22 DIAGNOSIS — F338 Other recurrent depressive disorders: Secondary | ICD-10-CM | POA: Diagnosis not present

## 2019-08-22 MED FILL — VYVANSE 60 MG CHEW: 60 | 90 days supply | Qty: 90 | Fill #0

## 2019-08-25 DIAGNOSIS — R6882 Decreased libido: Secondary | ICD-10-CM | POA: Diagnosis not present

## 2019-08-25 DIAGNOSIS — R5382 Chronic fatigue, unspecified: Secondary | ICD-10-CM | POA: Diagnosis not present

## 2019-08-25 DIAGNOSIS — N951 Menopausal and female climacteric states: Secondary | ICD-10-CM | POA: Diagnosis not present

## 2019-09-19 ENCOUNTER — Encounter: Payer: Self-pay | Admitting: Osteopathic Medicine

## 2019-09-22 ENCOUNTER — Ambulatory Visit: Payer: 59 | Admitting: Osteopathic Medicine

## 2019-09-25 ENCOUNTER — Ambulatory Visit (INDEPENDENT_AMBULATORY_CARE_PROVIDER_SITE_OTHER): Payer: 59 | Admitting: Osteopathic Medicine

## 2019-09-25 ENCOUNTER — Encounter: Payer: Self-pay | Admitting: Osteopathic Medicine

## 2019-09-25 VITALS — BP 106/66 | Temp 98.0°F | Wt 139.0 lb

## 2019-09-25 DIAGNOSIS — R5382 Chronic fatigue, unspecified: Secondary | ICD-10-CM | POA: Diagnosis not present

## 2019-09-25 DIAGNOSIS — R202 Paresthesia of skin: Secondary | ICD-10-CM

## 2019-09-25 DIAGNOSIS — R682 Dry mouth, unspecified: Secondary | ICD-10-CM

## 2019-09-25 DIAGNOSIS — H04129 Dry eye syndrome of unspecified lacrimal gland: Secondary | ICD-10-CM | POA: Diagnosis not present

## 2019-09-25 DIAGNOSIS — R519 Headache, unspecified: Secondary | ICD-10-CM | POA: Diagnosis not present

## 2019-09-25 NOTE — Progress Notes (Signed)
HPI: Megan Roman is a 44 y.o. female who  has a past medical history of Asthma, Complication of anesthesia, PMDD (premenstrual dysphoric disorder), PONV (postoperative nausea and vomiting), and Seasonal allergies.  she presents to [facility] today, 09/25/19, for chief complaint of:  Concerns about Sjogren's  Noting dry mouth, dry eyes, fatigue, headaches, weakness, numbness, sun sensitivity. Reviewed recent labs, TSH, Estradiol, Testosterone, progesterone ok.     ASSESSMENT/PLAN: The primary encounter diagnosis was Dry eye. Diagnoses of Dry mouth, Chronic fatigue, Paresthesia, and Nonintractable headache, unspecified chronicity pattern, unspecified headache type were also pertinent to this visit.  Symptoms concerning for Sjogren's, labs as below  Orders Placed This Encounter  Procedures  . CBC with Differential/Platelet  . COMPLETE METABOLIC PANEL WITH GFR  . TSH  . Urinalysis, Routine w reflex microscopic  . Sedimentation rate  . ANA  . Sjogren's syndrome antibods(ssa + ssb)  . Rheumatoid factor  . HIV Antibody (routine testing w rflx)  . Hepatitis C antibody  . Vitamin B12  . Anti-nuclear ab-titer (ANA titer)     No orders of the defined types were placed in this encounter.   There are no Patient Instructions on file for this visit.    Follow-up plan: Return for RECHECK PENDING RESULTS / IF WORSE OR CHANGE.                                                 ################################################# ################################################# ################################################# #################################################    Current Meds  Medication Sig  . Ascorbic Acid (VITAMIN C PO) Take 2 tablets by mouth daily.  Marland Kitchen buPROPion (WELLBUTRIN XL) 150 MG 24 hr tablet Take 150-300 mg by mouth See admin instructions. Take 1 tablet (150 mg) by mouth twice  daily, with the exception of 1 week prior to menses take 1 tablet (150 mg) in the morning & take 2 tablets (300 mg) by mouth in the afternoon.  Marland Kitchen FIBER PO Take 2 tablets by mouth daily.  . fluticasone (FLONASE) 50 MCG/ACT nasal spray Place 2 sprays into both nostrils daily as needed for allergies or rhinitis.  . Multiple Vitamin (MULTIVITAMIN WITH MINERALS) TABS tablet Take 1 tablet by mouth daily.  . Probiotic Product (PROBIOTIC PO) Take 2 tablets by mouth daily.  Marland Kitchen VYVANSE 60 MG CHEW Chew 30 mg by mouth 2 (two) times daily.     Allergies  Allergen Reactions  . Celexa [Citalopram Hydrobromide] Nausea Only  . Buspar [Buspirone] Nausea Only       Review of Systems: Pertinent (+) and (-) ROS in HPI as above   Exam:  BP 106/66 (BP Location: Right Arm, Patient Position: Sitting)   Temp 98 F (36.7 C)   Wt 139 lb (63 kg)   BMI 23.86 kg/m   Constitutional: VS see above. General Appearance: alert, well-developed, well-nourished, NAD  Neck: No masses, trachea midline.   Respiratory: Normal respiratory effort. no wheeze, no rhonchi, no rales  Cardiovascular: S1/S2 normal, no murmur, no rub/gallop auscultated. RRR.   Musculoskeletal: Gait normal. Symmetric and independent movement of all extremities  Abdominal: non-tender, non-distended, no appreciable organomegaly, neg Murphy's, BS WNLx4  Neurological: Normal balance/coordination. No tremor.  Skin: warm, dry, intact.   Psychiatric: Normal judgment/insight. Normal mood and affect. Oriented x3.       Visit summary with medication list and pertinent instructions was  printed for patient to review, patient was advised to alert [place] if any updates are needed. All questions at time of visit were answered - patient instructed to contact office with any additional concerns. ER/RTC precautions were reviewed with the patient and understanding verbalized.   Note: Total time spent 30 minutes, greater than 50% of the visit was spent  face-to-face counseling and coordinating care for the following: The primary encounter diagnosis was Dry eye. Diagnoses of Dry mouth, Chronic fatigue, Paresthesia, and Nonintractable headache, unspecified chronicity pattern, unspecified headache type were also pertinent to this visit.Marland Kitchen     Please note: voice recognition software was used to produce this document, and typos may escape review. Please contact Dr. Lyn Hollingshead for any needed clarifications.    Follow up plan: Return for RECHECK PENDING RESULTS / IF WORSE OR CHANGE.

## 2019-09-28 LAB — SJOGREN'S SYNDROME ANTIBODS(SSA + SSB)
SSA (Ro) (ENA) Antibody, IgG: <1 AI
SSB (La) (ENA) Antibody, IgG: <1 AI

## 2019-09-28 LAB — COMPLETE METABOLIC PANEL WITH GFR
AG Ratio: 2 (calc) (ref 1.0–2.5)
ALT: 21 U/L (ref 6–29)
AST: 15 U/L (ref 10–30)
Albumin: 4.2 g/dL (ref 3.6–5.1)
Alkaline phosphatase (APISO): 51 U/L (ref 31–125)
BUN: 19 mg/dL (ref 7–25)
CO2: 32 mmol/L (ref 20–32)
Calcium: 9.8 mg/dL (ref 8.6–10.2)
Chloride: 101 mmol/L (ref 98–110)
Creat: 0.76 mg/dL (ref 0.50–1.10)
GFR, Est African American: 112 mL/min/{1.73_m2} (ref >=60)
GFR, Est Non African American: 97 mL/min/{1.73_m2} (ref >=60)
Globulin: 2.1 g/dL (calc) (ref 1.9–3.7)
Glucose, Bld: 95 mg/dL (ref 65–99)
Potassium: 3.7 mmol/L (ref 3.5–5.3)
Sodium: 141 mmol/L (ref 135–146)
Total Bilirubin: 0.3 mg/dL (ref 0.2–1.2)
Total Protein: 6.3 g/dL (ref 6.1–8.1)

## 2019-09-28 LAB — CBC WITH DIFFERENTIAL/PLATELET
Absolute Monocytes: 589 cells/uL (ref 200–950)
Basophils Absolute: 33 cells/uL (ref 0–200)
Basophils Relative: 0.4 %
Eosinophils Absolute: 158 cells/uL (ref 15–500)
Eosinophils Relative: 1.9 %
HCT: 42.8 % (ref 35.0–45.0)
Hemoglobin: 14.4 g/dL (ref 11.7–15.5)
Lymphs Abs: 1627 cells/uL (ref 850–3900)
MCH: 32.7 pg (ref 27.0–33.0)
MCHC: 33.6 g/dL (ref 32.0–36.0)
MCV: 97.1 fL (ref 80.0–100.0)
MPV: 12.4 fL (ref 7.5–12.5)
Monocytes Relative: 7.1 %
Neutro Abs: 5893 cells/uL (ref 1500–7800)
Neutrophils Relative %: 71 %
Platelets: 236 10*3/uL (ref 140–400)
RBC: 4.41 10*6/uL (ref 3.80–5.10)
RDW: 11.9 % (ref 11.0–15.0)
Total Lymphocyte: 19.6 %
WBC: 8.3 10*3/uL (ref 3.8–10.8)

## 2019-09-28 LAB — URINALYSIS, ROUTINE W REFLEX MICROSCOPIC
Bilirubin Urine: NEGATIVE
Glucose, UA: NEGATIVE
Hgb urine dipstick: NEGATIVE
Ketones, ur: NEGATIVE
Leukocytes,Ua: NEGATIVE
Nitrite: NEGATIVE
Protein, ur: NEGATIVE
Specific Gravity, Urine: 1.02 (ref 1.001–1.03)
pH: 5.5 (ref 5.0–8.0)

## 2019-09-28 LAB — ANA: Anti Nuclear Antibody (ANA): POSITIVE — AB

## 2019-09-28 LAB — HIV ANTIBODY (ROUTINE TESTING W REFLEX): HIV 1&2 Ab, 4th Generation: NONREACTIVE

## 2019-09-28 LAB — ANTI-NUCLEAR AB-TITER (ANA TITER): ANA Titer 1: 1:80 {titer} — ABNORMAL HIGH

## 2019-09-28 LAB — RHEUMATOID FACTOR: Rheumatoid fact SerPl-aCnc: <14 IU/mL (ref <14)

## 2019-09-28 LAB — HEPATITIS C ANTIBODY
Hepatitis C Ab: NONREACTIVE
SIGNAL TO CUT-OFF: 0.01 (ref <1.00)

## 2019-09-28 LAB — TSH: TSH: 0.98 mIU/L

## 2019-09-28 LAB — SEDIMENTATION RATE: Sed Rate: 6 mm/h (ref 0–20)

## 2019-10-01 ENCOUNTER — Encounter: Payer: Self-pay | Admitting: Osteopathic Medicine

## 2019-10-15 ENCOUNTER — Ambulatory Visit (INDEPENDENT_AMBULATORY_CARE_PROVIDER_SITE_OTHER): Payer: 59 | Admitting: Otolaryngology

## 2019-10-15 ENCOUNTER — Other Ambulatory Visit: Payer: Self-pay

## 2019-10-15 ENCOUNTER — Encounter (INDEPENDENT_AMBULATORY_CARE_PROVIDER_SITE_OTHER): Payer: Self-pay | Admitting: Otolaryngology

## 2019-10-15 VITALS — Temp 98.1°F

## 2019-10-15 DIAGNOSIS — R682 Dry mouth, unspecified: Secondary | ICD-10-CM

## 2019-10-15 NOTE — Progress Notes (Signed)
HPI: Megan Roman is a 44 y.o. female who presents is referred by Dr. Lyn Hollingshead for evaluation of throat complaints.  Apparently she has had problems with a chronic dry mouth with some cracking at the corners of her lips and occasional sore or burning tongue that dates back to April.  She has tried several different medications without benefit.  She has had blood work which has been normal with normal thyroid levels. Denies any altered taste or smell.  Denies having had Covid.  She had her Covid vaccines in January and February. She does not smoke. She has been under a fair amount of stress and apparently her mother passed away last year..  Past Medical History:  Diagnosis Date  . Asthma   . Complication of anesthesia   . PMDD (premenstrual dysphoric disorder)   . PONV (postoperative nausea and vomiting)   . Seasonal allergies    Past Surgical History:  Procedure Laterality Date  . ABLATION    . CESAREAN SECTION  10/2007  . CESAREAN SECTION  01/2012  . CHOLECYSTECTOMY N/A 01/02/2019   Procedure: LAPAROSCOPIC CHOLECYSTECTOMY;  Surgeon: Gaynelle Adu, MD;  Location: [facility];  Service: General;  Laterality: N/A;  . TUBAL LIGATION  01/2012   Social History   Socioeconomic History  . Marital status: Married    Spouse name: Not on file  . Number of children: Not on file  . Years of education: Not on file  . Highest education level: Not on file  Occupational History  . Not on file  Tobacco Use  . Smoking status: Never Smoker  . Smokeless tobacco: Never Used  Vaping Use  . Vaping Use: Never used  Substance and Sexual Activity  . Alcohol use: Yes    Alcohol/week: 1.0 standard drink    Types: 1 Glasses of wine per week    Comment: occas  . Drug use: No  . Sexual activity: Yes    Partners: Male    Birth control/protection: Surgical  Other Topics Concern  . Not on file  Social History Narrative  . Not on file   Social Determinants of Health   Financial Resource Strain:   .  Difficulty of Paying Living Expenses:   Food Insecurity:   . Worried About Programme researcher, broadcasting/film/video in the Last Year:   . Barista in the Last Year:   Transportation Needs:   . Freight forwarder (Medical):   Marland Kitchen Lack of Transportation (Non-Medical):   Physical Activity:   . Days of Exercise per Week:   . Minutes of Exercise per Session:   Stress:   . Feeling of Stress :   Social Connections:   . Frequency of Communication with Friends and Family:   . Frequency of Social Gatherings with Friends and Family:   . Attends Religious Services:   . Active Member of Clubs or Organizations:   . Attends Banker Meetings:   Marland Kitchen Marital Status:    Family History  Problem Relation Age of Onset  . Alcohol abuse Paternal Grandfather   . Depression Mother   . Hypertension Mother   . Hyperlipidemia Mother   . Asthma Mother   . Diabetes Other        paternal grandparents  . Hyperlipidemia Father   . Hypertension Father   . Cancer Paternal Grandmother        thyroid cancer    Allergies  Allergen Reactions  . Celexa [Citalopram Hydrobromide] Nausea Only  . Buspar [  Buspirone] Nausea Only   Prior to Admission medications   Medication Sig Start Date End Date Taking? Authorizing Provider  Ascorbic Acid (VITAMIN C PO) Take 2 tablets by mouth daily.   Yes [provider]  buPROPion (WELLBUTRIN XL) 150 MG 24 hr tablet Take 150-300 mg by mouth See admin instructions. Take 1 tablet (150 mg) by mouth twice daily, with the exception of 1 week prior to menses take 1 tablet (150 mg) in the morning & take 2 tablets (300 mg) by mouth in the afternoon. 01/29/18  Yes [provider]  FIBER PO Take 2 tablets by mouth daily.   Yes [provider]  fluticasone (FLONASE) 50 MCG/ACT nasal spray Place 2 sprays into both nostrils daily as needed for allergies or rhinitis.   Yes [provider]  Multiple Vitamin (MULTIVITAMIN WITH MINERALS) TABS tablet Take 1 tablet  by mouth daily.   Yes [provider]  Probiotic Product (PROBIOTIC PO) Take 2 tablets by mouth daily.   Yes [provider]  VYVANSE 60 MG CHEW Chew 30 mg by mouth 2 (two) times daily.  08/14/18  Yes [provider]  APPLE CIDER VINEGAR PO Take 1-2 capsules by mouth 3 (three) times daily as needed (gallstone attacks). [employer] Gummies    [provider]  Cyanocobalamin (VITAMELTS ENERGY VITAMIN B-12 PO) Take 1 tablet by mouth daily as needed (energy boost).    [provider]  ibuprofen (ADVIL) 200 MG tablet Take 600-800 mg by mouth every 8 (eight) hours as needed (pain.).    [provider]  naphazoline-pheniramine (ALLERGY EYE) 0.025-0.3 % ophthalmic solution Place 1 drop into both eyes 4 (four) times daily as needed for eye irritation.    [provider]  oxyCODONE (OXY IR/ROXICODONE) 5 MG immediate release tablet Take 1 tablet (5 mg total) by mouth every 6 (six) hours as needed for severe pain. Patient not taking: Reported on 09/25/2019 01/02/19   Gaynelle Adu, MD  Soft Lens Products (REWETTING DROPS) SOLN Place 1 drop into both eyes 3 (three) times daily as needed (dry/irritated eyes.).    [provider]     Positive ROS: Otherwise negative  All other systems have been reviewed and were otherwise negative with the exception of those mentioned in the HPI and as above.  Physical Exam: Constitutional: Alert, well-appearing, no acute distress Ears: External ears without lesions or tenderness. Ear canals are clear bilaterally with intact, clear TMs.  Nasal: External nose without lesions. Clear nasal passages with no signs of infection. Oral: Lips and gums without lesions. Tongue and palate mucosa without lesions. Posterior oropharynx clear.  Oral mucosa was clear and appeared moist in the office today.  Drainage from both the parotid duct as well as semitubular duct was clear bilaterally.  No palpable abnormality of the parotid or  submandibular glands.  She has some minimal crusting at the corners of her mouth on both sides.  Dorsum of the tongue appeared normal with no evidence of thrush or infection. Neck: No palpable adenopathy or masses.  No significant palpable adenopathy in the neck. Respiratory: Breathing comfortably  Skin: No facial/neck lesions or rash noted.  Procedures  Assessment: Dry mouth complaints with essentially normal clinical examination.  I suspect this may be more related to anxiety and stress.  There is no evidence of infection.  Not really typical of Sjogren's disease and apparently screening test with ANA was negative.  But could consider minor salivary gland biopsy if symptoms persist.  Plan: Reviewed with patient today concerning normal examination.  Suggested using lip balm for the dryness or cracking at the corner of the lips.  Suggested using Biotene (Rembrandt) brand as this is beneficial in some people with dry mouth. This could possibly be related to stress or anxiety as she has no obvious clinical findings. I also suggested that she follow-up with her dentist for oral exam and recommendations.   Radene Journey, MD   CC:

## 2019-11-19 DIAGNOSIS — F902 Attention-deficit hyperactivity disorder, combined type: Secondary | ICD-10-CM | POA: Diagnosis not present

## 2019-11-19 DIAGNOSIS — Z79899 Other long term (current) drug therapy: Secondary | ICD-10-CM | POA: Diagnosis not present

## 2019-11-19 DIAGNOSIS — F419 Anxiety disorder, unspecified: Secondary | ICD-10-CM | POA: Diagnosis not present

## 2019-11-19 MED FILL — VYVANSE 60 MG CHEW: 60 | 90 days supply | Qty: 90 | Fill #0

## 2019-12-15 ENCOUNTER — Other Ambulatory Visit: Payer: Self-pay

## 2019-12-15 ENCOUNTER — Other Ambulatory Visit (HOSPITAL_COMMUNITY)
Admission: RE | Admit: 2019-12-15 | Discharge: 2019-12-15 | Disposition: A | Discharge status: Home / Self Care | Payer: 59 | Source: Ambulatory Visit | Attending: Obstetrics and Gynecology | Admitting: Obstetrics and Gynecology

## 2019-12-15 ENCOUNTER — Encounter: Payer: Self-pay | Admitting: Obstetrics and Gynecology

## 2019-12-15 ENCOUNTER — Ambulatory Visit (INDEPENDENT_AMBULATORY_CARE_PROVIDER_SITE_OTHER): Payer: 59 | Admitting: Obstetrics and Gynecology

## 2019-12-15 VITALS — BP 107/64 | HR 83 | Ht 64.5 in | Wt 137.0 lb

## 2019-12-15 DIAGNOSIS — Z1231 Encounter for screening mammogram for malignant neoplasm of breast: Secondary | ICD-10-CM | POA: Diagnosis not present

## 2019-12-15 DIAGNOSIS — N951 Menopausal and female climacteric states: Secondary | ICD-10-CM

## 2019-12-15 DIAGNOSIS — R454 Irritability and anger: Secondary | ICD-10-CM

## 2019-12-15 DIAGNOSIS — Z01419 Encounter for gynecological examination (general) (routine) without abnormal findings: Secondary | ICD-10-CM | POA: Diagnosis not present

## 2019-12-15 DIAGNOSIS — Z124 Encounter for screening for malignant neoplasm of cervix: Secondary | ICD-10-CM

## 2019-12-15 LAB — FOLLICLE STIMULATING HORMONE: FSH: 5.2 m[IU]/mL

## 2019-12-15 NOTE — Progress Notes (Signed)
GYNECOLOGY ANNUAL PREVENTATIVE CARE ENCOUNTER NOTE  Subjective:   Megan Roman is a 44 y.o. G67P2002 female here for a annual gynecologic exam. Current complaints: feeling chronically tired, or has issues falling asleep. Also feeling irritable all the time. Used to have these symptoms one week out of the month but now feeling them all the time, particularly since February.  Swelling and tightness in left leg, dry mouth, lips are cracked all the time. Feels chronically fatigued and irritable. Dry skin. Has been seeing PCP and ENT.   Denies abnormal vaginal bleeding, discharge, pelvic pain, problems with intercourse or other gynecologic concerns. Declines STI screen.   Gynecologic History No LMP recorded. Patient has had an ablation. Contraception: tubal ligation Last Pap: 03/2018. Results: negative Last mammogram: 03/2019. Results: Birads 1 DEXA: has never had  Obstetric History OB History  Gravida Para Term Preterm AB Living  2 2 2     2   SAB TAB Ectopic Multiple Live Births               # Outcome Date GA Lbr Len/2nd Weight Sex Delivery Anes PTL Lv  2 Term      CS-LTranv     1 Term      CS-LTranv       Past Medical History:  Diagnosis Date   Asthma    Complication of anesthesia    PMDD (premenstrual dysphoric disorder)    PONV (postoperative nausea and vomiting)    Seasonal allergies     Past Surgical History:  Procedure Laterality Date   ABLATION     CESAREAN SECTION  10/2007   CESAREAN SECTION  01/2012   CHOLECYSTECTOMY N/A 01/02/2019   Procedure: LAPAROSCOPIC CHOLECYSTECTOMY;  Surgeon: 03/04/2019, MD;  Location: [facility];  Service: General;  Laterality: N/A;   TUBAL LIGATION  01/2012    Current Outpatient Medications on File Prior to Visit  Medication Sig Dispense Refill   Ascorbic Acid (VITAMIN C PO) Take 2 tablets by mouth daily.     FIBER PO Take 2 tablets by mouth daily.     fluticasone (FLONASE) 50 MCG/ACT nasal spray Place 2 sprays into  both nostrils daily as needed for allergies or rhinitis.     Multiple Vitamin (MULTIVITAMIN WITH MINERALS) TABS tablet Take 1 tablet by mouth daily.     Probiotic Product (PROBIOTIC PO) Take 2 tablets by mouth daily.     VYVANSE 60 MG CHEW Chew 30 mg by mouth 2 (two) times daily.      No current facility-administered medications on file prior to visit.    Allergies  Allergen Reactions   Celexa [Citalopram Hydrobromide] Nausea Only   Buspar [Buspirone] Nausea Only    Social History   Socioeconomic History   Marital status: Married    Spouse name: Not on file   Number of children: Not on file   Years of education: Not on file   Highest education level: Not on file  Occupational History   Not on file  Tobacco Use   Smoking status: Never Smoker   Smokeless tobacco: Never Used  Vaping Use   Vaping Use: Never used  Substance and Sexual Activity   Alcohol use: Yes    Alcohol/week: 1.0 standard drink    Types: 1 Glasses of wine per week    Comment: occas   Drug use: No   Sexual activity: Yes    Partners: Male    Birth control/protection: Surgical  Other Topics Concern  Not on file  Social History Narrative   Not on file   Social Determinants of Health   Financial Resource Strain:    Difficulty of Paying Living Expenses:   Food Insecurity:    Worried About Programme researcher, broadcasting/film/video in the Last Year:    Barista in the Last Year:   Transportation Needs:    Freight forwarder (Medical):    Lack of Transportation (Non-Medical):   Physical Activity:    Days of Exercise per Week:    Minutes of Exercise per Session:   Stress:    Feeling of Stress :   Social Connections:    Frequency of Communication with Friends and Family:    Frequency of Social Gatherings with Friends and Family:    Attends Religious Services:    Active Member of Clubs or Organizations:    Attends Engineer, structural:    Marital Status:   Intimate  Partner Violence:    Fear of Current or Ex-Partner:    Emotionally Abused:    Physically Abused:    Sexually Abused:     Family History  Problem Relation Age of Onset   Alcohol abuse Paternal Grandfather    Depression Mother    Hypertension Mother    Hyperlipidemia Mother    Asthma Mother    Diabetes Other        paternal grandparents   Hyperlipidemia Father    Hypertension Father    Cancer Paternal Grandmother        thyroid cancer    The following portions of the patient's history were reviewed and updated as appropriate: allergies, current medications, past family history, past medical history, past social history, past surgical history and problem list.  Review of Systems Pertinent items are noted in HPI.   Objective:  BP 107/64    Pulse 83    Ht 5' 4.5" (1.638 m)    Wt 137 lb (62.1 kg)    BMI 23.15 kg/m  CONSTITUTIONAL: Well-developed, well-nourished female in no acute distress.  HENT:  Normocephalic, atraumatic, External right and left ear normal. Oropharynx is clear and moist EYES: Conjunctivae and EOM are normal. Pupils are equal, round, and reactive to light. No scleral icterus.  NECK: Normal range of motion, supple, no masses.  Normal thyroid.  SKIN: Skin is warm and dry. No rash noted. Not diaphoretic. No erythema. No pallor. NEUROLOGIC: Alert and oriented to person, place, and time. Normal reflexes, muscle tone coordination. No cranial nerve deficit noted. PSYCHIATRIC: Normal mood and affect. Normal behavior. Normal judgment and thought content. CARDIOVASCULAR: Normal heart rate noted, regular rhythm RESPIRATORY: Clear to auscultation bilaterally. Effort and breath sounds normal, no problems with respiration noted. BREASTS: Symmetric in size. No masses, skin changes, nipple drainage, or lymphadenopathy. ABDOMEN: Soft, no distention noted.  No tenderness, rebound or guarding.  PELVIC: Normal appearing external genitalia; normal appearing vaginal mucosa  and cervix.  No abnormal discharge noted.  Pap smear obtained. Normal uterine size, no other palpable masses, no uterine or adnexal tenderness. MUSCULOSKELETAL: Normal range of motion. No tenderness.  No cyanosis, clubbing, or edema.  2+ distal pulses.  Exam done with chaperone present.  Assessment and Plan:   1. Well woman exam Healthy exam  2. Cervical cancer screening - Cytology - PAP( [facility])  3. Encounter for screening mammogram for malignant neoplasm of breast - MM 3D SCREEN BREAST BILATERAL; Future  4. Hot flushes, perimenopausal Irritability and fatigue may be due to perimenopause, pt  not having usual cycles like she had and this may be stress related or she may be starting perimenopause - Follicle stimulating hormone  5. Irritability F/u with PCP for other symptoms   Will follow up results of pap smear and manage accordingly. Encouraged improvement in diet and exercise.  COVID vaccine UTD Declines STI screen. Mammogram ordered today Referral for colonoscopy n/a DEXA not due based on age  Routine preventative health maintenance measures emphasized. Please refer to After Visit Summary for other counseling recommendations.   Total face-to-face time with patient: 32 minutes. Over 50% of encounter was spent on counseling and coordination of care.   Baldemar Lenis, M.D. Attending [facility] [employer] [facility])

## 2019-12-18 LAB — CYTOLOGY - PAP
Comment: NEGATIVE
Diagnosis: NEGATIVE
High risk HPV: NEGATIVE

## 2020-02-19 DIAGNOSIS — Z79899 Other long term (current) drug therapy: Secondary | ICD-10-CM | POA: Diagnosis not present

## 2020-02-19 DIAGNOSIS — F419 Anxiety disorder, unspecified: Secondary | ICD-10-CM | POA: Diagnosis not present

## 2020-02-19 DIAGNOSIS — F902 Attention-deficit hyperactivity disorder, combined type: Secondary | ICD-10-CM | POA: Diagnosis not present

## 2020-02-20 ENCOUNTER — Other Ambulatory Visit (HOSPITAL_COMMUNITY): Payer: Self-pay | Admitting: Internal Medicine

## 2020-02-20 MED FILL — VYVANSE 60 MG CHEW: 60 | 90 days supply | Qty: 90 | Fill #0

## 2020-03-19 ENCOUNTER — Telehealth: Payer: Self-pay | Admitting: Osteopathic Medicine

## 2020-03-19 NOTE — Telephone Encounter (Signed)
No

## 2020-03-19 NOTE — Telephone Encounter (Signed)
Patient is requesting to switch PCP for a second opinion on some of her issues, most of them are not resolved and is wonder if seeing another PCP might help.

## 2020-03-19 NOTE — Telephone Encounter (Signed)
Chart reviewed and I don't think that I would have anything additional to offer that Dr. Lyn Hollingshead hasn't already checked.  I would encourage her to follow up with Dr. Lyn Hollingshead to discuss ongoing issues.

## 2020-04-05 ENCOUNTER — Encounter: Payer: Self-pay | Admitting: Emergency Medicine

## 2020-04-05 ENCOUNTER — Emergency Department (INDEPENDENT_AMBULATORY_CARE_PROVIDER_SITE_OTHER): Payer: 59

## 2020-04-05 ENCOUNTER — Emergency Department
Admission: EM | Admit: 2020-04-05 | Discharge: 2020-04-05 | Disposition: A | Discharge status: Home / Self Care | Payer: 59 | Source: Home / Self Care

## 2020-04-05 ENCOUNTER — Other Ambulatory Visit: Payer: Self-pay

## 2020-04-05 DIAGNOSIS — M545 Low back pain, unspecified: Secondary | ICD-10-CM | POA: Diagnosis not present

## 2020-04-05 LAB — POCT URINALYSIS DIP (MANUAL ENTRY)
Bilirubin, UA: NEGATIVE
Glucose, UA: NEGATIVE mg/dL
Ketones, POC UA: NEGATIVE mg/dL
Leukocytes, UA: NEGATIVE
Nitrite, UA: NEGATIVE
Protein Ur, POC: NEGATIVE mg/dL
Spec Grav, UA: 1.015 (ref 1.010–1.025)
Urobilinogen, UA: 0.2 E.U./dL
pH, UA: 5.5 (ref 5.0–8.0)

## 2020-04-05 MED ORDER — PREDNISONE 50 MG PO TABS: 50.0000 mg | ORAL_TABLET | Freq: Every day | ORAL | 0 refills | Status: AC

## 2020-04-05 MED ORDER — KETOROLAC TROMETHAMINE 60 MG/2ML IM SOLN
60.0000 mg | Freq: Once | INTRAMUSCULAR | Status: AC
  Administered 2020-04-05: 60 mg via INTRAMUSCULAR

## 2020-04-05 NOTE — ED Provider Notes (Signed)
[facility]    CSN: [id number] Arrival date & time: 04/05/20  1126      History   Chief Complaint Chief Complaint  Patient presents with  . Back Pain    HPI Megan Roman is a 44 y.o. female.   HPI Megan Roman is a 44 y.o. female presenting to UC with c/o bilateral low back pain for several weeks, worsened yesterday.  Pt has an appointment with her PCP on Friday, 12/10 for her back pain but states pain was so severe yesterday, her husband encouraged her to be evaluated today.  Pain is worse with movement, worse on Right side.  She took ibuprofen the other day and and old oxycodone last night without relief. She has also taken an "all natural" OTC muscle relaxer but no relief, states it makes her tired.  Pt notes she feels more constipated than usual and wonders if that is contributing to her pain.  Pain is a dull ache, 4/10.  No radiation of pain or numbness in arms or legs. No incontinence. No known injury.  Hx of low back pain after having her daughter 12 years ago. She also reports hx of back pain from prolonged sitting at work, went to PT in 2014.  He been doing well since but does report having a positive ANA recently. Plans to discuss with PCP later this week.     Past Medical History:  Diagnosis Date  . Asthma   . Complication of anesthesia   . PMDD (premenstrual dysphoric disorder)   . PONV (postoperative nausea and vomiting)   . Seasonal allergies     Patient Active Problem List   Diagnosis Date Noted  . Generalized anxiety disorder 07/07/2015  . PMDD (premenstrual dysphoric disorder) 04/09/2013  . Attention and concentration deficit 04/09/2013    Past Surgical History:  Procedure Laterality Date  . ABLATION    . CESAREAN SECTION  10/2007  . CESAREAN SECTION  01/2012  . CHOLECYSTECTOMY N/A 01/02/2019   Procedure: LAPAROSCOPIC CHOLECYSTECTOMY;  Surgeon: Gaynelle Adu, MD;  Location: [facility];  Service: General;  Laterality: N/A;  . TUBAL LIGATION   01/2012    OB History    Gravida  2   Para  2   Term  2   Preterm      AB      Living  2     SAB      TAB      Ectopic      Multiple      Live Births               Home Medications    Prior to Admission medications   Medication Sig Start Date End Date Taking? Authorizing Provider  Ascorbic Acid (VITAMIN C PO) Take 2 tablets by mouth daily.   Yes [provider]  FIBER PO Take 2 tablets by mouth daily.   Yes [provider]  Multiple Vitamin (MULTIVITAMIN WITH MINERALS) TABS tablet Take 1 tablet by mouth daily.   Yes [provider]  VYVANSE 60 MG CHEW Chew 30 mg by mouth 2 (two) times daily.  08/14/18  Yes [provider]  fluticasone (FLONASE) 50 MCG/ACT nasal spray Place 2 sprays into both nostrils daily as needed for allergies or rhinitis.    [provider]  predniSONE (DELTASONE) 50 MG tablet Take 1 tablet (50 mg total) by mouth daily with breakfast for 5 days. 04/05/20 04/10/20  Lurene Shadow, PA-C  Probiotic  Product (PROBIOTIC PO) Take 2 tablets by mouth daily.    [provider]    Family History Family History  Problem Relation Age of Onset  . Alcohol abuse Paternal Grandfather   . Depression Mother   . Hypertension Mother   . Hyperlipidemia Mother   . Asthma Mother   . Diabetes Other        paternal grandparents  . Hyperlipidemia Father   . Hypertension Father   . Cancer Paternal Grandmother        thyroid cancer   . Healthy Sister     Social History Social History   Tobacco Use  . Smoking status: Never Smoker  . Smokeless tobacco: Never Used  Vaping Use  . Vaping Use: Never used  Substance Use Topics  . Alcohol use: Yes    Alcohol/week: 1.0 standard drink    Types: 1 Glasses of wine per week    Comment: occas  . Drug use: No     Allergies   Celexa [citalopram hydrobromide] and Buspar [buspirone]   Review of Systems Review of Systems  Musculoskeletal: Positive for  arthralgias, back pain and myalgias. Negative for neck pain and neck stiffness.  Skin: Negative for color change and wound.  Neurological: Negative for weakness and numbness.     Physical Exam Triage Vital Signs ED Triage Vitals  Enc Vitals Group     BP 04/05/20 1139 106/70     Pulse Rate 04/05/20 1139 83     Resp 04/05/20 1139 15     Temp 04/05/20 1139 98.9 F (37.2 C)     Temp Source 04/05/20 1139 Oral     SpO2 04/05/20 1139 99 %     Weight 04/05/20 1146 132 lb (59.9 kg)     Height 04/05/20 1146 5' 4.5" (1.638 m)     Head Circumference --      Peak Flow --      Pain Score 04/05/20 1145 4     Pain Loc --      Pain Edu? --      Excl. in GC? --    No data found.  Updated Vital Signs BP 106/70 (BP Location: Left Arm)   Pulse 83   Temp 98.9 F (37.2 C) (Oral)   Resp 15   Ht 5' 4.5" (1.638 m)   Wt 132 lb (59.9 kg)   SpO2 99%   BMI 22.31 kg/m   Physical Exam Vitals and nursing note reviewed.  Constitutional:      General: She is not in acute distress.    Appearance: Normal appearance. She is well-developed. She is not ill-appearing, toxic-appearing or diaphoretic.  HENT:     Head: Normocephalic and atraumatic.  Cardiovascular:     Rate and Rhythm: Normal rate and regular rhythm.  Pulmonary:     Effort: Pulmonary effort is normal. No respiratory distress.     Breath sounds: Normal breath sounds.  Abdominal:     Palpations: Abdomen is soft.     Tenderness: There is no right CVA tenderness or left CVA tenderness.  Musculoskeletal:        General: Tenderness present. Normal range of motion.     Cervical back: Normal range of motion and neck supple. No rigidity or tenderness.     Lumbar back: Tenderness and bony tenderness present. No deformity. Normal range of motion. Negative right straight leg raise test and negative left straight leg raise test.       Back:  Skin:  General: Skin is warm and dry.     Findings: No bruising, erythema or rash.  Neurological:      Mental Status: She is alert and oriented to person, place, and time.  Psychiatric:        Behavior: Behavior normal.      UC Treatments / Results  Labs (all labs ordered are listed, but only abnormal results are displayed) Labs Reviewed  POCT URINALYSIS DIP (MANUAL ENTRY) - Abnormal; Notable for the following components:      Result Value   Blood, UA trace-intact (*)    All other components within normal limits    EKG   Radiology DG Lumbar Spine Complete  Result Date: 04/05/2020 CLINICAL DATA:  Low back pain for several weeks EXAM: LUMBAR SPINE - COMPLETE 4+ VIEW COMPARISON:  None. FINDINGS: There is no evidence of lumbar spine fracture. Alignment is normal. Intervertebral disc spaces are maintained. Facet joints within normal limits. Visualized portions of the bilateral SI joints are within normal limits. IMPRESSION: Negative. Electronically Signed   By: Duanne Guess D.O.   On: 04/05/2020 12:49    Procedures Procedures (including critical care time)  Medications Ordered in UC Medications  ketorolac (TORADOL) injection 60 mg (60 mg Intramuscular Given 04/05/20 1224)    Initial Impression / Assessment and Plan / UC Course  I have reviewed the triage vital signs and the nursing notes.  Pertinent labs & imaging results that were available during my care of the patient were reviewed by me and considered in my medical decision making (see chart for details).     Low back pain without red flag symptoms Discussed imaging with pt Encouraged f/u with PCP later this week as previously scheduled, may also f/u with Sports Medicine Discussed symptoms that warrant emergent care in the ED. AVS given  Final Clinical Impressions(s) / UC Diagnoses   Final diagnoses:  Acute bilateral low back pain without sciatica     Discharge Instructions      Call to schedule a follow up appointment with Sports Medicine this week for recheck of back pain or at least follow up with your  PCP as previously scheduled.   Call 911 or have someone drive you to the hospital if symptoms significantly worsening.     ED Prescriptions    Medication Sig Dispense Auth. Provider   predniSONE (DELTASONE) 50 MG tablet Take 1 tablet (50 mg total) by mouth daily with breakfast for 5 days. 5 tablet Lurene Shadow, PA-C     PDMP not reviewed this encounter.   Lurene Shadow, [place] 04/05/20 1423

## 2020-04-05 NOTE — ED Triage Notes (Signed)
Low back pain for a few weeks - has an appointment w/ new PCP on Friday Pain worse since yesterday on right side  Pain worse with movement  Took old Oxy last night - no relief Describes pain as constant dull ache  Pfizer vaccine in February - does not plan on getting a booster - believes her ongoing problems are related to 2nd Pfizer shot  Hx of back pain after her daughter was born 12 years ago

## 2020-04-05 NOTE — Discharge Instructions (Signed)
  Call to schedule a follow up appointment with Sports Medicine this week for recheck of back pain or at least follow up with your PCP as previously scheduled.   Call 911 or have someone drive you to the hospital if symptoms significantly worsening.

## 2020-04-09 DIAGNOSIS — D539 Nutritional anemia, unspecified: Secondary | ICD-10-CM | POA: Diagnosis not present

## 2020-04-09 DIAGNOSIS — F32A Depression, unspecified: Secondary | ICD-10-CM | POA: Diagnosis not present

## 2020-04-09 DIAGNOSIS — R5383 Other fatigue: Secondary | ICD-10-CM | POA: Diagnosis not present

## 2020-04-09 DIAGNOSIS — R3129 Other microscopic hematuria: Secondary | ICD-10-CM | POA: Diagnosis not present

## 2020-04-09 DIAGNOSIS — M129 Arthropathy, unspecified: Secondary | ICD-10-CM | POA: Diagnosis not present

## 2020-04-09 DIAGNOSIS — Z79899 Other long term (current) drug therapy: Secondary | ICD-10-CM | POA: Diagnosis not present

## 2020-04-09 DIAGNOSIS — Z20822 Contact with and (suspected) exposure to covid-19: Secondary | ICD-10-CM | POA: Diagnosis not present

## 2020-04-09 DIAGNOSIS — F419 Anxiety disorder, unspecified: Secondary | ICD-10-CM | POA: Diagnosis not present

## 2020-04-28 DIAGNOSIS — M545 Low back pain, unspecified: Secondary | ICD-10-CM | POA: Diagnosis not present

## 2020-04-28 DIAGNOSIS — R3129 Other microscopic hematuria: Secondary | ICD-10-CM | POA: Diagnosis not present

## 2020-04-28 DIAGNOSIS — R29898 Other symptoms and signs involving the musculoskeletal system: Secondary | ICD-10-CM | POA: Diagnosis not present

## 2020-04-28 DIAGNOSIS — R5382 Chronic fatigue, unspecified: Secondary | ICD-10-CM | POA: Diagnosis not present

## 2020-05-11 ENCOUNTER — Other Ambulatory Visit: Payer: Self-pay

## 2020-05-11 DIAGNOSIS — R102 Pelvic and perineal pain: Secondary | ICD-10-CM

## 2020-05-11 NOTE — Progress Notes (Signed)
TVUS order placed per Dr Earlene Plater

## 2020-05-20 ENCOUNTER — Other Ambulatory Visit (HOSPITAL_COMMUNITY): Payer: Self-pay | Admitting: Physician Assistant

## 2020-05-20 DIAGNOSIS — Z9109 Other allergy status, other than to drugs and biological substances: Secondary | ICD-10-CM | POA: Diagnosis not present

## 2020-05-20 MED FILL — NITROFURANTOIN MONO-MCR 100: 100 | 7 days supply | Qty: 14 | Fill #0

## 2020-05-24 ENCOUNTER — Other Ambulatory Visit (HOSPITAL_COMMUNITY): Payer: Self-pay | Admitting: Allergy and Immunology

## 2020-05-24 DIAGNOSIS — J302 Other seasonal allergic rhinitis: Secondary | ICD-10-CM | POA: Diagnosis not present

## 2020-05-24 DIAGNOSIS — J45990 Exercise induced bronchospasm: Secondary | ICD-10-CM | POA: Diagnosis not present

## 2020-05-24 DIAGNOSIS — J3081 Allergic rhinitis due to animal (cat) (dog) hair and dander: Secondary | ICD-10-CM | POA: Diagnosis not present

## 2020-05-24 DIAGNOSIS — Z87898 Personal history of other specified conditions: Secondary | ICD-10-CM | POA: Diagnosis not present

## 2020-05-24 DIAGNOSIS — L309 Dermatitis, unspecified: Secondary | ICD-10-CM | POA: Diagnosis not present

## 2020-05-24 DIAGNOSIS — J3089 Other allergic rhinitis: Secondary | ICD-10-CM | POA: Diagnosis not present

## 2020-05-24 MED FILL — MUPIROCIN 2% OINTMENT: 2 | 5 days supply | Qty: 22 | Fill #0

## 2020-05-26 ENCOUNTER — Other Ambulatory Visit: Payer: Self-pay | Admitting: Obstetrics and Gynecology

## 2020-05-26 DIAGNOSIS — Z1231 Encounter for screening mammogram for malignant neoplasm of breast: Secondary | ICD-10-CM

## 2020-05-27 ENCOUNTER — Other Ambulatory Visit: Payer: 59

## 2020-05-27 DIAGNOSIS — Z1231 Encounter for screening mammogram for malignant neoplasm of breast: Secondary | ICD-10-CM

## 2020-05-30 ENCOUNTER — Other Ambulatory Visit (HOSPITAL_COMMUNITY): Payer: Self-pay | Admitting: Internal Medicine

## 2020-05-31 MED FILL — VYVANSE 60 MG CHEW: 60 | 90 days supply | Qty: 90 | Fill #0

## 2020-06-03 ENCOUNTER — Ambulatory Visit (INDEPENDENT_AMBULATORY_CARE_PROVIDER_SITE_OTHER): Payer: 59

## 2020-06-03 ENCOUNTER — Other Ambulatory Visit: Payer: Self-pay

## 2020-06-03 DIAGNOSIS — Z1231 Encounter for screening mammogram for malignant neoplasm of breast: Secondary | ICD-10-CM | POA: Diagnosis not present

## 2020-06-03 DIAGNOSIS — R102 Pelvic and perineal pain: Secondary | ICD-10-CM

## 2020-06-07 DIAGNOSIS — H52223 Regular astigmatism, bilateral: Secondary | ICD-10-CM | POA: Diagnosis not present

## 2020-06-07 DIAGNOSIS — Z135 Encounter for screening for eye and ear disorders: Secondary | ICD-10-CM | POA: Diagnosis not present

## 2020-06-07 DIAGNOSIS — H5203 Hypermetropia, bilateral: Secondary | ICD-10-CM | POA: Diagnosis not present

## 2020-06-07 DIAGNOSIS — H524 Presbyopia: Secondary | ICD-10-CM | POA: Diagnosis not present

## 2020-06-07 DIAGNOSIS — H04123 Dry eye syndrome of bilateral lacrimal glands: Secondary | ICD-10-CM | POA: Diagnosis not present

## 2020-06-11 DIAGNOSIS — J3081 Allergic rhinitis due to animal (cat) (dog) hair and dander: Secondary | ICD-10-CM | POA: Diagnosis not present

## 2020-06-11 DIAGNOSIS — J45990 Exercise induced bronchospasm: Secondary | ICD-10-CM | POA: Diagnosis not present

## 2020-06-11 DIAGNOSIS — L309 Dermatitis, unspecified: Secondary | ICD-10-CM | POA: Diagnosis not present

## 2020-06-11 DIAGNOSIS — L501 Idiopathic urticaria: Secondary | ICD-10-CM | POA: Diagnosis not present

## 2020-06-11 MED FILL — MUPIROCIN 2% OINTMENT: 2 | 5 days supply | Qty: 22 | Fill #0

## 2020-06-17 ENCOUNTER — Encounter: Payer: Self-pay | Admitting: Obstetrics and Gynecology

## 2020-06-17 ENCOUNTER — Other Ambulatory Visit: Payer: Self-pay

## 2020-06-17 ENCOUNTER — Ambulatory Visit: Payer: 59 | Admitting: Obstetrics and Gynecology

## 2020-06-17 VITALS — BP 111/76 | HR 88 | Ht 64.0 in | Wt 140.0 lb

## 2020-06-17 DIAGNOSIS — R319 Hematuria, unspecified: Secondary | ICD-10-CM | POA: Diagnosis not present

## 2020-06-17 DIAGNOSIS — R140 Abdominal distension (gaseous): Secondary | ICD-10-CM | POA: Diagnosis not present

## 2020-06-17 DIAGNOSIS — R102 Pelvic and perineal pain: Secondary | ICD-10-CM

## 2020-06-17 NOTE — Progress Notes (Signed)
GYNECOLOGY OFFICE FOLLOW UP NOTE  History:  45 y.o. [id number] here today for follow up for pelvic cramping and bloating. Has noted that she feels these symptoms around the same time of the month each month. She had an uterine ablation several years ago and is starting to have spotting again but does not have periods. She also had severe back pain which necessitated a visit to urgent care, pain was worse than child birth, and that then subsequently wrapped around her right flank to the right side of her pelvis. Was sore after that but did not have ongoing pain. Has noted to have blood in urine on several different dips.    Past Medical History:  Diagnosis Date  . Asthma   . Complication of anesthesia   . PMDD (premenstrual dysphoric disorder)   . PONV (postoperative nausea and vomiting)   . Seasonal allergies     Past Surgical History:  Procedure Laterality Date  . ABLATION    . CESAREAN SECTION  10/2007  . CESAREAN SECTION  01/2012  . CHOLECYSTECTOMY N/A 01/02/2019   Procedure: LAPAROSCOPIC CHOLECYSTECTOMY;  Surgeon: Gaynelle Adu, MD;  Location: [facility];  Service: General;  Laterality: N/A;  . TUBAL LIGATION  01/2012     Current Outpatient Medications:  .  Ascorbic Acid (VITAMIN C PO), Take 2 tablets by mouth daily., Disp: , Rfl:  .  FIBER PO, Take 2 tablets by mouth daily., Disp: , Rfl:  .  Multiple Vitamin (MULTIVITAMIN WITH MINERALS) TABS tablet, Take 1 tablet by mouth daily., Disp: , Rfl:  .  Probiotic Product (PROBIOTIC PO), Take 2 tablets by mouth daily., Disp: , Rfl:  .  VYVANSE 60 MG CHEW, Chew 30 mg by mouth 2 (two) times daily. , Disp: , Rfl:  .  fluticasone (FLONASE) 50 MCG/ACT nasal spray, Place 2 sprays into both nostrils daily as needed for allergies or rhinitis., Disp: , Rfl:   The following portions of the patient's history were reviewed and updated as appropriate: allergies, current medications, past family history, past medical history, past social history, past  surgical history and problem list.   Review of Systems:  Pertinent items noted in HPI and remainder of comprehensive ROS otherwise negative.   Objective:  Physical Exam BP 111/76   Pulse 88   Ht 5\' 4"  (1.626 m)   Wt 140 lb (63.5 kg)   BMI 24.03 kg/m  CONSTITUTIONAL: Well-developed, well-nourished female in no acute distress.  HENT:  Normocephalic, atraumatic. External right and left ear normal. Oropharynx is clear and moist EYES: Conjunctivae and EOM are normal. Pupils are equal, round, and reactive to light. No scleral icterus.  NECK: Normal range of motion, supple, no masses SKIN: Skin is warm and dry. No rash noted. Not diaphoretic. No erythema. No pallor. NEUROLOGIC: Alert and oriented to person, place, and time. Normal reflexes, muscle tone coordination. No cranial nerve deficit noted. PSYCHIATRIC: Normal mood and affect. Normal behavior. Normal judgment and thought content. CARDIOVASCULAR: Normal heart rate noted RESPIRATORY: Effort normal, no problems with respiration noted ABDOMEN: Soft, no distention noted.   PELVIC: deferred MUSCULOSKELETAL: Normal range of motion. No edema noted.  Labs and Imaging Transvaginal Non-OB  Result Date: 06/03/2020 CLINICAL DATA:  Pelvic pain for 3 months intermittently, history of tubal ligation, Caesarean section, RIGHT ovarian cyst, endometrial ablation EXAM: ULTRASOUND PELVIS TRANSVAGINAL TECHNIQUE: Transvaginal ultrasound examination of the pelvis was performed including evaluation of the uterus, ovaries, adnexal regions, and pelvic cul-de-sac. COMPARISON:  None FINDINGS: Uterus Measurements:  6.8 x 4.3 x 4.9 cm = volume: 75 mL. Heterogeneous myometrium, cannot exclude adenomyosis with this appearance. Shadowing from Caesarean section scar. No focal uterine mass identified. Endometrium Not visualized, either obscured or as a result of prior endometrial ablation. No endometrial fluid. Right ovary Measurements: 3.9 x 1.7 x 2.9 cm = volume: 10 mL.  Normal morphology without mass Left ovary Measurements: 3.1 x 2.0 x 2.2 cm = volume: 7 mL. Normal morphology without mass Other findings:  No free pelvic fluid.  No adnexal masses. IMPRESSION: Nonvisualization of endometrial complex as above. Heterogeneous myometrium, cannot exclude adenomyosis with this appearance. Normal appearing ovaries. Electronically Signed   By: Ulyses Southward M.D.   On: 06/03/2020 10:55   MM 3D SCREEN BREAST BILATERAL  Result Date: 06/04/2020 CLINICAL DATA:  Screening. EXAM: DIGITAL SCREENING BILATERAL MAMMOGRAM WITH TOMOSYNTHESIS AND CAD COMPARISON:  Previous exam(s). ACR Breast Density Category c: The breast tissue is heterogeneously dense, which may obscure small masses. FINDINGS: There are no findings suspicious for malignancy. The images were evaluated with computer-aided detection. IMPRESSION: No mammographic evidence of malignancy. A result letter of this screening mammogram will be mailed directly to the patient. RECOMMENDATION: Screening mammogram in one year. (Code:SM-B-01Y) BI-RADS CATEGORY  1: Negative. Electronically Signed   By: Baird Lyons M.D.   On: 06/04/2020 13:24    Assessment & Plan:  1. Pelvic pain - As her symptoms are around the same time each month, these may be PMS symptoms, states her mother had PMDD  - she may benefit from hormonal management of symptoms however patient has not had good experiences in the past and would like to explore homeopathic remedies for this - also interested in possible SSRI treatment but would like to see Rheum prior to trialing something like this - she may also experience improved symptoms if she has auto-immune disease diagnosed and treated, she is in process of obtaining referral now  2. Bloating  3. Hematuria, unspecified type - pt reports blood in urine on multiple urine dips at outside sites, noted on 12/21 visit in EMR - Referral to urology - Back pain wrapping to flank may be kidney stone, regardless needs workup  for hematuria   Routine preventative health maintenance measures emphasized. Please refer to After Visit Summary for other counseling recommendations.   Return in about 3 months (around 09/14/2020) for after visit with rheumatology.  Total face-to-face time with patient: 25 minutes. Over 50% of encounter was spent on counseling and coordination of care.  Baldemar Lenis, MD, [facility] Attending Center for [facility])

## 2020-06-23 ENCOUNTER — Other Ambulatory Visit (HOSPITAL_COMMUNITY): Payer: Self-pay | Admitting: Physician Assistant

## 2020-06-23 DIAGNOSIS — Z8742 Personal history of other diseases of the female genital tract: Secondary | ICD-10-CM | POA: Diagnosis not present

## 2020-06-23 DIAGNOSIS — R5382 Chronic fatigue, unspecified: Secondary | ICD-10-CM | POA: Diagnosis not present

## 2020-06-23 DIAGNOSIS — M545 Low back pain, unspecified: Secondary | ICD-10-CM | POA: Diagnosis not present

## 2020-06-23 DIAGNOSIS — R3129 Other microscopic hematuria: Secondary | ICD-10-CM | POA: Diagnosis not present

## 2020-06-23 DIAGNOSIS — R102 Pelvic and perineal pain: Secondary | ICD-10-CM | POA: Diagnosis not present

## 2020-06-23 MED FILL — traMADol HCL 50 MG TABS: 50 | 5 days supply | Qty: 15 | Fill #0

## 2020-06-23 MED FILL — DULoxetine HCL 30 MG CPEP: 30 | 30 days supply | Qty: 30 | Fill #0

## 2020-06-28 DIAGNOSIS — F419 Anxiety disorder, unspecified: Secondary | ICD-10-CM | POA: Diagnosis not present

## 2020-06-28 DIAGNOSIS — F902 Attention-deficit hyperactivity disorder, combined type: Secondary | ICD-10-CM | POA: Diagnosis not present

## 2020-06-28 DIAGNOSIS — Z79899 Other long term (current) drug therapy: Secondary | ICD-10-CM | POA: Diagnosis not present

## 2020-07-29 DIAGNOSIS — R3121 Asymptomatic microscopic hematuria: Secondary | ICD-10-CM | POA: Diagnosis not present

## 2020-07-29 DIAGNOSIS — R1084 Generalized abdominal pain: Secondary | ICD-10-CM | POA: Diagnosis not present

## 2020-08-02 ENCOUNTER — Encounter: Payer: Self-pay | Admitting: *Deleted

## 2020-09-03 ENCOUNTER — Other Ambulatory Visit (HOSPITAL_COMMUNITY): Payer: Self-pay

## 2020-09-03 MED ORDER — VYVANSE 60 MG PO CHEW
1.0000 | CHEWABLE_TABLET | Freq: Every day | ORAL | 0 refills | Status: DC
  Filled 2020-09-03: qty 90, 90d supply, fill #0

## 2020-09-06 ENCOUNTER — Other Ambulatory Visit (HOSPITAL_COMMUNITY): Payer: Self-pay

## 2020-10-04 DIAGNOSIS — Z79899 Other long term (current) drug therapy: Secondary | ICD-10-CM | POA: Diagnosis not present

## 2020-10-04 DIAGNOSIS — F902 Attention-deficit hyperactivity disorder, combined type: Secondary | ICD-10-CM | POA: Diagnosis not present

## 2020-12-01 ENCOUNTER — Other Ambulatory Visit (HOSPITAL_COMMUNITY): Payer: Self-pay

## 2020-12-02 ENCOUNTER — Other Ambulatory Visit (HOSPITAL_COMMUNITY): Payer: Self-pay

## 2020-12-03 ENCOUNTER — Other Ambulatory Visit (HOSPITAL_COMMUNITY): Payer: Self-pay

## 2020-12-07 ENCOUNTER — Other Ambulatory Visit (HOSPITAL_COMMUNITY): Payer: Self-pay

## 2020-12-08 ENCOUNTER — Other Ambulatory Visit (HOSPITAL_COMMUNITY): Payer: Self-pay

## 2020-12-08 MED ORDER — VYVANSE 60 MG PO CHEW
1.0000 | CHEWABLE_TABLET | Freq: Every day | ORAL | 0 refills | Status: AC
  Filled 2020-12-08: qty 30, 30d supply, fill #0

## 2020-12-09 ENCOUNTER — Other Ambulatory Visit (HOSPITAL_COMMUNITY): Payer: Self-pay

## 2021-10-11 ENCOUNTER — Other Ambulatory Visit (HOSPITAL_BASED_OUTPATIENT_CLINIC_OR_DEPARTMENT_OTHER): Payer: Self-pay | Admitting: Obstetrics and Gynecology

## 2021-10-11 DIAGNOSIS — Z1231 Encounter for screening mammogram for malignant neoplasm of breast: Secondary | ICD-10-CM

## 2021-10-20 ENCOUNTER — Ambulatory Visit (INDEPENDENT_AMBULATORY_CARE_PROVIDER_SITE_OTHER): Payer: 59

## 2021-10-20 DIAGNOSIS — Z1231 Encounter for screening mammogram for malignant neoplasm of breast: Secondary | ICD-10-CM

## 2022-10-23 ENCOUNTER — Other Ambulatory Visit: Payer: Self-pay | Admitting: Obstetrics and Gynecology

## 2022-10-23 DIAGNOSIS — Z1231 Encounter for screening mammogram for malignant neoplasm of breast: Secondary | ICD-10-CM

## 2022-10-25 ENCOUNTER — Ambulatory Visit (INDEPENDENT_AMBULATORY_CARE_PROVIDER_SITE_OTHER): Payer: 59

## 2022-10-25 DIAGNOSIS — Z1231 Encounter for screening mammogram for malignant neoplasm of breast: Secondary | ICD-10-CM

## 2023-12-18 ENCOUNTER — Other Ambulatory Visit: Payer: Self-pay | Admitting: Obstetrics and Gynecology

## 2023-12-18 DIAGNOSIS — Z1231 Encounter for screening mammogram for malignant neoplasm of breast: Secondary | ICD-10-CM

## 2023-12-19 ENCOUNTER — Encounter

## 2023-12-27 ENCOUNTER — Ambulatory Visit

## 2023-12-27 DIAGNOSIS — Z1231 Encounter for screening mammogram for malignant neoplasm of breast: Secondary | ICD-10-CM

## 2024-01-03 IMAGING — MG MM DIGITAL SCREENING BILAT W/ TOMO AND CAD
8 series · 8 of 24 positions shown · non-contrast
Comparison: Previous exam(s).

CLINICAL DATA: Screening.

EXAM:
DIGITAL SCREENING BILATERAL MAMMOGRAM WITH TOMOSYNTHESIS AND CAD
TECHNIQUE: Bilateral screening digital craniocaudal and mediolateral oblique
mammograms were obtained. Bilateral screening digital breast
tomosynthesis was performed. The images were evaluated with
computer-aided detection.

[R CC synth-2D]
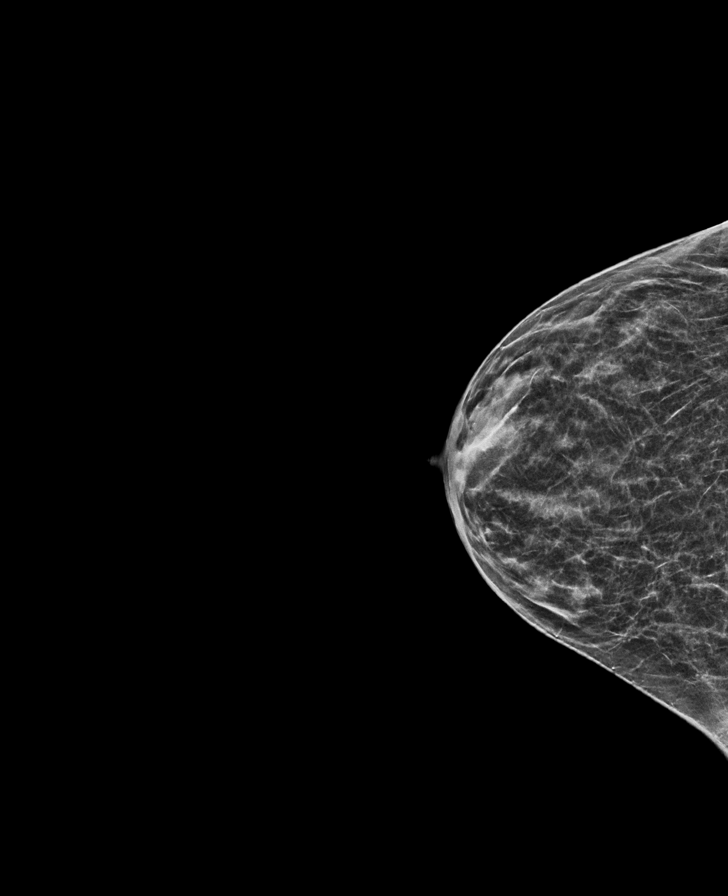

[R MLO synth-2D]
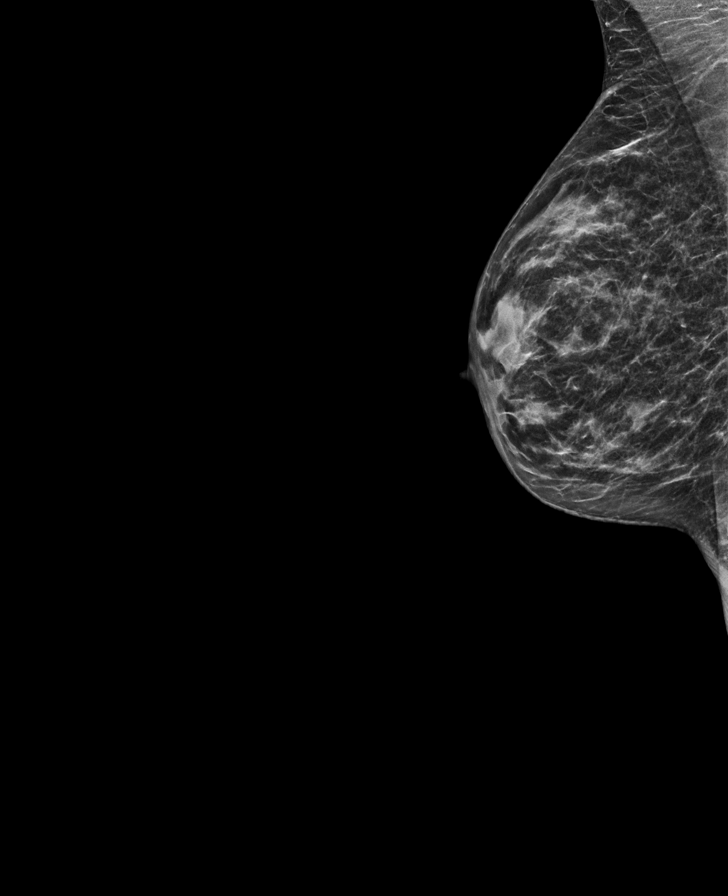

[L MLO synth-2D]
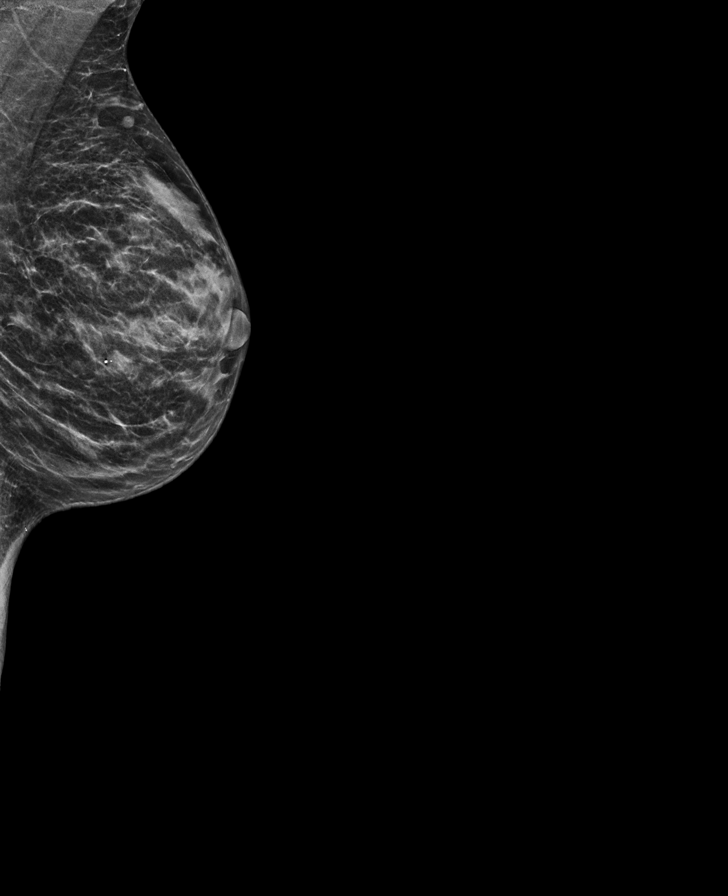

[L CC synth-2D]
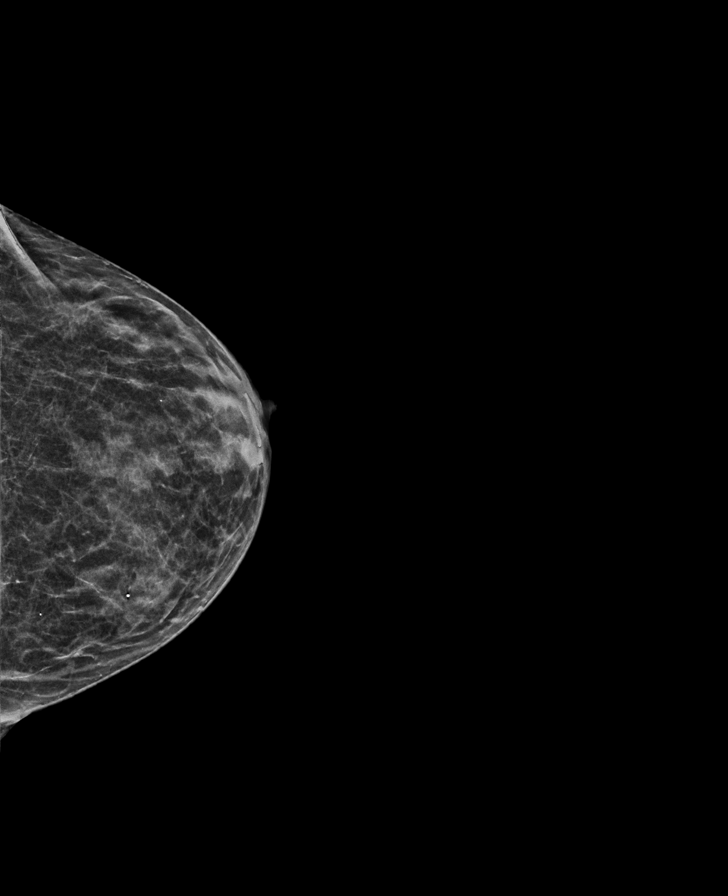

[R MLO tomo · tomo slice 24/47.0]
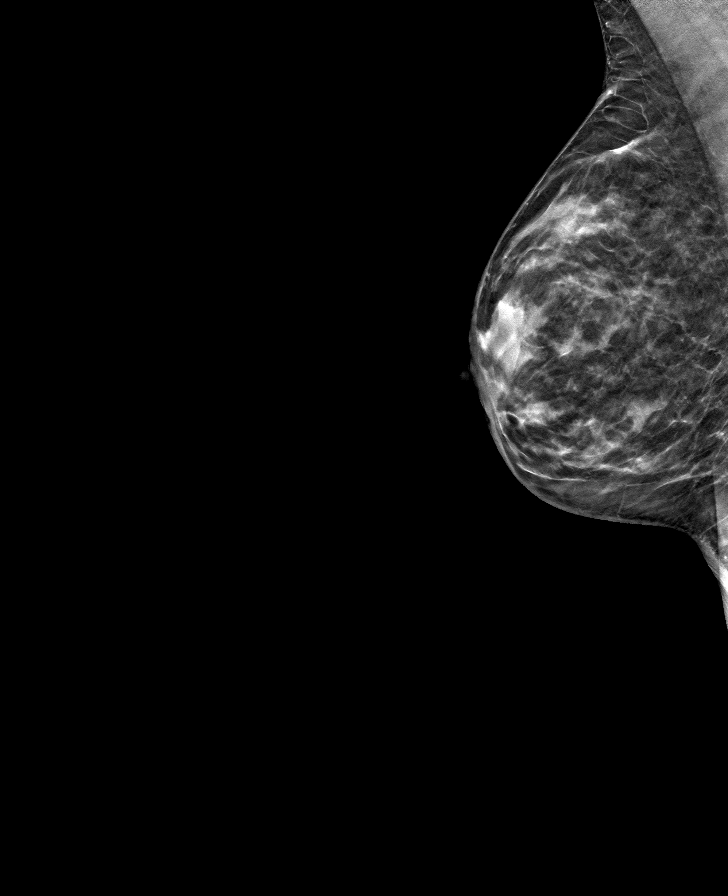

[R CC tomo · tomo slice 25/48.0]
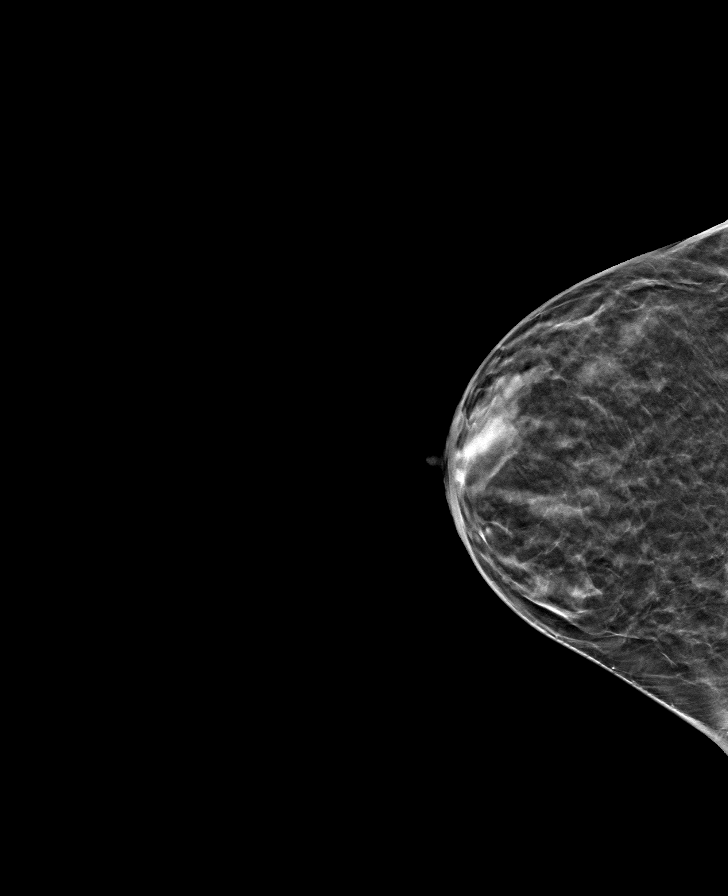

[L CC tomo · tomo slice 23/46.0]
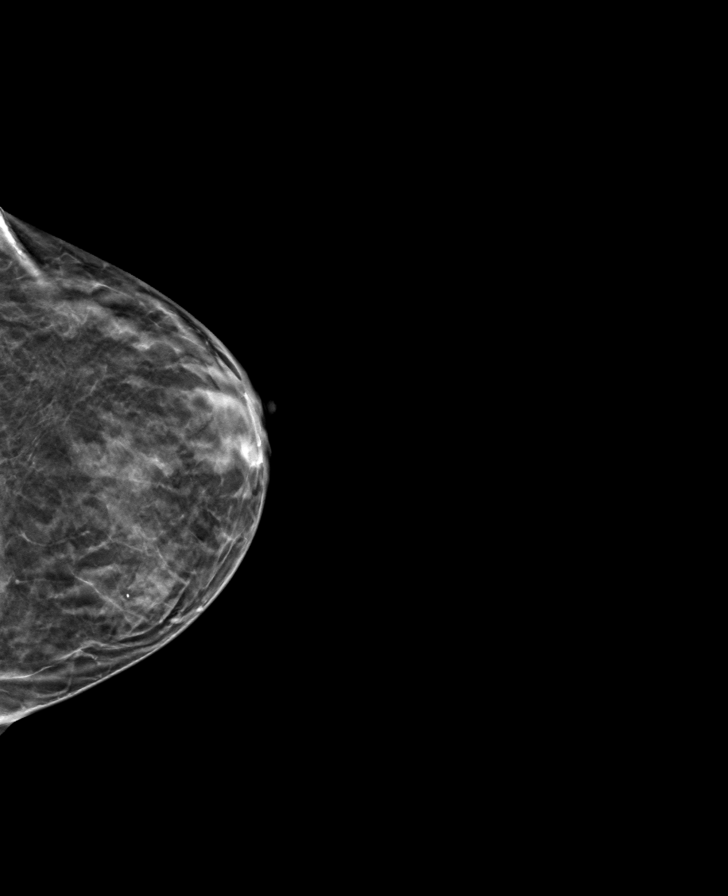

[L MLO tomo · tomo slice 22/43.0]
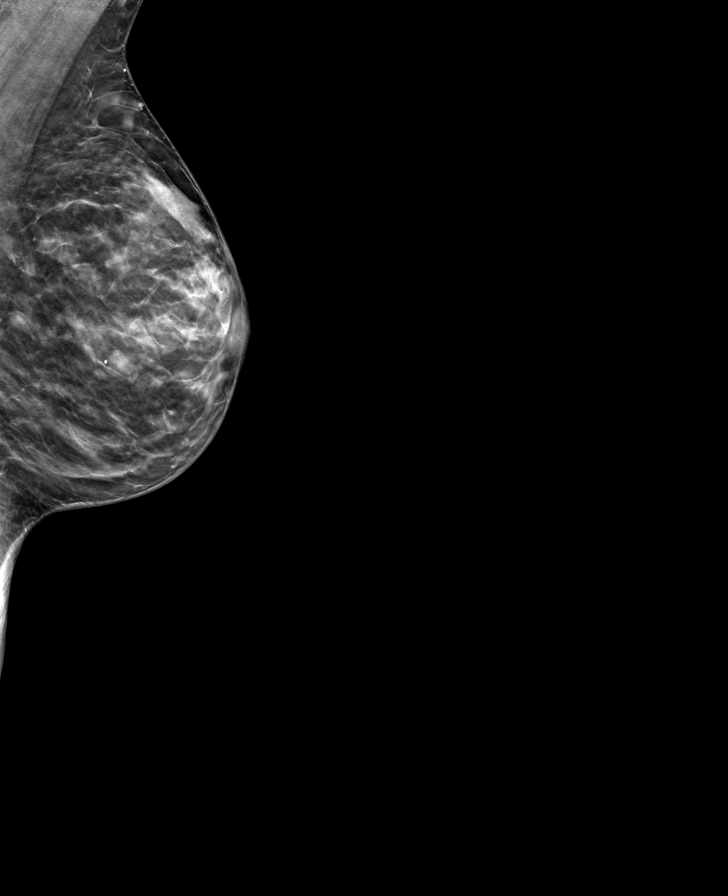

[8 of 24 positions shown; findings below may reference images not displayed]

ACR Breast Density Category c: The breast tissue is heterogeneously
dense, which may obscure small masses.
FINDINGS: There are no findings suspicious for malignancy.
IMPRESSION: No mammographic evidence of malignancy. A result letter of this
screening mammogram will be mailed directly to the patient.

RECOMMENDATION:
Screening mammogram in one year. (Code:Q3-W-BC3)

BI-RADS CATEGORY  1: Negative.

## 2024-01-04 ENCOUNTER — Ambulatory Visit: Payer: Self-pay | Admitting: Obstetrics and Gynecology
